# Patient Record
Sex: Male | Born: 1980
Health system: Southern US, Community
[De-identification: ages and names within clinical notes are randomized; demographics above are authoritative.]

## PROBLEM LIST (undated history)

## (undated) DIAGNOSIS — I10 Essential (primary) hypertension: Secondary | ICD-10-CM

## (undated) DIAGNOSIS — M25569 Pain in unspecified knee: Secondary | ICD-10-CM

## (undated) DIAGNOSIS — R51 Headache: Secondary | ICD-10-CM

## (undated) DIAGNOSIS — G8929 Other chronic pain: Secondary | ICD-10-CM

## (undated) HISTORY — PX: COLONOSCOPY: SHX174

## (undated) HISTORY — PX: APPENDECTOMY: SHX54

---

## 2001-05-23 ENCOUNTER — Emergency Department (HOSPITAL_COMMUNITY): Admission: EM | Admit: 2001-05-23 | Discharge: 2001-05-24 | Payer: Self-pay | Admitting: Emergency Medicine

## 2005-03-31 ENCOUNTER — Ambulatory Visit (HOSPITAL_COMMUNITY): Admission: RE | Admit: 2005-03-31 | Discharge: 2005-03-31 | Payer: Self-pay | Admitting: Pulmonary Disease

## 2005-04-01 ENCOUNTER — Ambulatory Visit (HOSPITAL_COMMUNITY): Admission: RE | Admit: 2005-04-01 | Discharge: 2005-04-01 | Payer: Self-pay | Admitting: Pulmonary Disease

## 2007-06-03 ENCOUNTER — Ambulatory Visit (HOSPITAL_COMMUNITY): Admission: RE | Admit: 2007-06-03 | Discharge: 2007-06-03 | Payer: Self-pay | Admitting: Pulmonary Disease

## 2007-09-01 ENCOUNTER — Inpatient Hospital Stay (HOSPITAL_COMMUNITY): Admission: AD | Admit: 2007-09-01 | Discharge: 2007-09-02 | Payer: Self-pay | Admitting: Psychiatry

## 2007-09-01 ENCOUNTER — Ambulatory Visit: Payer: Self-pay | Admitting: Psychiatry

## 2007-09-01 ENCOUNTER — Emergency Department (HOSPITAL_COMMUNITY): Admission: EM | Admit: 2007-09-01 | Discharge: 2007-09-01 | Payer: Self-pay | Admitting: Emergency Medicine

## 2010-02-01 ENCOUNTER — Other Ambulatory Visit (HOSPITAL_COMMUNITY): Payer: Self-pay | Admitting: Pulmonary Disease

## 2010-02-01 DIAGNOSIS — M542 Cervicalgia: Secondary | ICD-10-CM

## 2010-02-01 DIAGNOSIS — M549 Dorsalgia, unspecified: Secondary | ICD-10-CM

## 2010-02-04 ENCOUNTER — Encounter (HOSPITAL_COMMUNITY): Payer: Self-pay

## 2010-02-04 ENCOUNTER — Ambulatory Visit (HOSPITAL_COMMUNITY)
Admission: RE | Admit: 2010-02-04 | Discharge: 2010-02-04 | Disposition: A | Discharge status: Home / Self Care | Payer: Self-pay | Source: Ambulatory Visit | Attending: Pulmonary Disease | Admitting: Pulmonary Disease

## 2010-02-04 DIAGNOSIS — M5020 Other cervical disc displacement, unspecified cervical region: Secondary | ICD-10-CM | POA: Insufficient documentation

## 2010-02-04 DIAGNOSIS — M542 Cervicalgia: Secondary | ICD-10-CM | POA: Insufficient documentation

## 2010-02-05 ENCOUNTER — Ambulatory Visit (HOSPITAL_COMMUNITY)
Admission: RE | Admit: 2010-02-05 | Discharge: 2010-02-05 | Disposition: A | Discharge status: Home / Self Care | Payer: Self-pay | Source: Ambulatory Visit | Attending: Pulmonary Disease | Admitting: Pulmonary Disease

## 2010-02-05 ENCOUNTER — Encounter (HOSPITAL_COMMUNITY): Payer: Self-pay

## 2010-02-05 DIAGNOSIS — M549 Dorsalgia, unspecified: Secondary | ICD-10-CM

## 2010-02-05 DIAGNOSIS — M5126 Other intervertebral disc displacement, lumbar region: Secondary | ICD-10-CM | POA: Insufficient documentation

## 2010-02-05 DIAGNOSIS — M545 Low back pain, unspecified: Secondary | ICD-10-CM | POA: Insufficient documentation

## 2010-05-18 NOTE — H&P (Signed)
NAMEMarland Kitchen  NELSON, NOONE                 ACCOUNT NO.:  [id number]   MEDICAL RECORD NO.:  [id number]          PATIENT TYPE:  IPS   LOCATION:  0502                          FACILITY:  [facility]   PHYSICIAN:  Geoffery Lyons, M.D.      DATE OF BIRTH:  April 14, 1980   DATE OF ADMISSION:  09/01/2007  DATE OF DISCHARGE:                       PSYCHIATRIC ADMISSION ASSESSMENT   IDENTIFYING INFORMATION/JUSTIFICATION FOR ADMISSION AND CARE:  A 30-year-  old male that was voluntarily admitted on September 01, 2007.   HISTORY OF PRESENT ILLNESS:  The patient reports that he has been  depressed since his separation from his wife, he has been drinking  since.  He states on a slow day he drinks 5-6 beers, 12 ounces, and on  rough days will drink up to 18 at a time.  He has been drinking daily  for the past 4 months.  Wanted to get help, wanted to be sober for his  son.  He has had some recent conflict with his wife that he is separated  from, having some difficulty with her seeing other people, also  endorsing some financial issues although he is working full-time.  He  also reports ongoing back pain that he is being treated for by his  primary care Liandro Thelin, states his work adds to his back pain.   PAST PSYCHIATRIC HISTORY:  First admission to [facility].  No current outpatient mental health treatment.   SOCIAL HISTORY:  Patient is separated.  He has 62-1/2-year-old son.  Patient is able to see his son every Monday and Wednesday and every  other weekend.  The patient works at [employer], reporting  work is very busy at this time.  He does have a DUI 3 years ago.   FAMILY HISTORY:  Denies.   ALCOHOL/DRUG HISTORY:  Again, as above.  No history of seizures.   PRIMARY CARE Deshayla Empson:  Kari Baars, MD.   MEDICAL PROBLEMS:  Back pain.   MEDICATION:  Listed as Vicoprofen 7.5/200 one q.i.d. p.r.n.   DRUG ALLERGIES:  None.   PHYSICAL EXAM:  This is a well-nourished male who appears in no  acute  distress.  No tremors are noted.  Physical exam was reviewed in the  emergency department with no significant findings.  Temperature of 98.6,  55 heart rate, 18 respirations, blood pressure is 126/82.  He is 196  pounds.  He is 6 feet 3-1/4 inches tall.  Alcohol level in emergency  room was 143.  Urine drug screen is positive for opiates and positive  for benzodiazepines.  CBC is within normal limits.  His BMET is within  normal limits.  Liver enzymes are unavailable for review.   MENTAL STATUS EXAM:  This is a fully alert, cooperative male, casually  dressed, good eye contact.  Speech is clear, normal pace and tone.  Mood  is depressed, somewhat anxious.  Patient affect appears sad with some  depressed features.  Thought processes are coherent, no evidence of any  delusional statements.  Denies any suicidal thoughts.  Cognitive  function intact.  His memory is  good, judgment and insight is fair.   AXIS I:  1.  Depressive disorder, not otherwise specified.  2.  Alcohol  abuse, rule out dependence.  AXIS II:  Deferred.  AXIS III:  Back pain.  AXIS IV:  Problems with primary support group, which is separation from  wife, economic issues and medical problems.  AXIS V:  Current is 40.   PLAN:  To detox the patient on Librium protocol, work on relapse  intervention.  Lexapro was discussed with the patient.  Patient is  agreeable to resuming medication.  Patient may benefit from some  individual counseling.  Will also go ahead and order a liver function  and assess comorbidities.  The tentative length of stay is 2-3 days.      Landry Corporal, N.P.      Geoffery Lyons, M.D.  Electronically Signed    JO/MEDQ  D:  09/02/2007  T:  09/02/2007  Job:  [id number]

## 2010-05-21 NOTE — Discharge Summary (Signed)
NAMEMarland Hays  JODI, KAPPES                 ACCOUNT NO.:  [id number]   MEDICAL RECORD NO.:  [id number]          PATIENT TYPE:  IPS   LOCATION:  0502                          FACILITY:  [facility]   PHYSICIAN:  Geoffery Lyons, M.D.      DATE OF BIRTH:  1980-03-17   DATE OF ADMISSION:  09/01/2007  DATE OF DISCHARGE:  09/02/2007                               DISCHARGE SUMMARY   CHIEF COMPLAINT/HISTORY OF PRESENT ILLNESS:  This was the first  admission to [facility] for this 30 year old male,  voluntarily admitted.  Reported he has been depressed since his  separation from his wife.  He has been drinking since then.  A slow day  he drinks 5 to 6 beers, 12 ounces, and on a rough day he will drink up  to 18.  Has been drinking daily for the past 4 months.  Wanted to get  help.  Wanted to be sober for his son.  Has had some recent conflict  with his wife, who he is separated from, having some difficulty with her  seeing other people.  Also endorsing some financial issues although he  is working full time.  Also endorsed back pain that is being treated by  his primary care Loyal Holzheimer.   PAST PSYCHIATRIC HISTORY:  First time at [facility].  No current  outpatient treatment.   ALCOHOL AND DRUG HISTORY:  Increased use of alcohol.  No other  substances.   MEDICAL HISTORY:  Back pain.   MEDICATIONS:  Vicoprofen 7.5/200 one 4 times a day as needed.   PHYSICAL EXAMINATION:  Failed to show any acute findings.   LABORATORY WORKUP:  UDS positive for opiates and benzodiazepines.  Alcohol level 143.  BMET within normal limits.  CBC within normal  limits.   MENTAL STATUS EXAM:  Reveals a fully alert, cooperative male, casually  dressed.  Good eye contact.  Speech is clear, normal in pace, tone and  production.  Mood is anxious, depressed.  Affect depressed.  Thought  processes logical, coherent and relevant.  No evidence of delusions.  Denied any acute suicidal or homicidal ideations.  No  hallucinations.  Cognition well-preserved.   ADMISSION DIAGNOSES:  AXIS I:  Alcohol abuse, rule out dependence.  Depressive disorder not otherwise specified.  AXIS II:  No diagnosis.  AXIS III:  Back pain.  AXIS IV: Moderate.  AXIS V:  On admission 40, highest GAF in the last year 60.   COURSE IN THE HOSPITAL:  He was admitted.  He was detoxed with Librium.  As already stated, has been increasing his use of alcohol since  separation.  Endorsed increased depression.  Works with [employer]  and [employer], and this has taken a toll on his back.  There are  problems with money.  Conflict eventually became too much.  In 2003 he  split for 10-1/2 months.  He was given Lexapro.  Endorsed that he wanted  some help.  He initially was committed to stay in the unit.  As the day  progressed, he was wanting to be discharged.  He was denying active  suicidal or homicidal ideas.  The family was present, and they were  mostly putting pressure for him to be discharged home.  As he was in  full contact with reality, without any active suicidal or homicidal  ideations and was willing to pursue outpatient treatment, we went ahead  and discharged to outpatient followup.   DISCHARGE DIAGNOSES:  AXIS I:  Depressive disorder not otherwise  specified.  Alcohol abuse.  AXIS II:  No diagnosis.  AXIS III:  Back pain.  AXIS IV:  Moderate.  AXIS V:  On discharge 50.   Discharged on no medications.  He refused followup.      Geoffery Lyons, M.D.  Electronically Signed     IL/MEDQ  D:  09/14/2007  T:  09/15/2007  Job:  [id number]

## 2010-07-24 ENCOUNTER — Emergency Department (HOSPITAL_COMMUNITY): Payer: Self-pay

## 2010-07-24 ENCOUNTER — Emergency Department (HOSPITAL_COMMUNITY)
Admission: EM | Admit: 2010-07-24 | Discharge: 2010-07-25 | Disposition: A | Discharge status: Home / Self Care | Payer: Self-pay | Attending: Emergency Medicine | Admitting: Emergency Medicine

## 2010-07-24 DIAGNOSIS — G8929 Other chronic pain: Secondary | ICD-10-CM | POA: Insufficient documentation

## 2010-07-24 DIAGNOSIS — G4700 Insomnia, unspecified: Secondary | ICD-10-CM | POA: Insufficient documentation

## 2010-07-24 DIAGNOSIS — M549 Dorsalgia, unspecified: Secondary | ICD-10-CM | POA: Insufficient documentation

## 2010-07-24 DIAGNOSIS — F1920 Other psychoactive substance dependence, uncomplicated: Secondary | ICD-10-CM | POA: Insufficient documentation

## 2010-07-24 LAB — CBC
MCH: 30.6 pg (ref 26.0–34.0)
MCHC: 33.5 g/dL (ref 30.0–36.0)
MCV: 91.1 fL (ref 78.0–100.0)
RDW: 13 % (ref 11.5–15.5)
WBC: 19.6 10*3/uL — ABNORMAL HIGH (ref 4.0–10.5)

## 2010-07-24 LAB — DIFFERENTIAL
Basophils Absolute: 0.1 10*3/uL (ref 0.0–0.1)
Eosinophils Relative: 0 % (ref 0–5)
Lymphs Abs: 3.7 10*3/uL (ref 0.7–4.0)
Monocytes Absolute: 1.6 10*3/uL — ABNORMAL HIGH (ref 0.1–1.0)
Monocytes Relative: 8 % (ref 3–12)

## 2010-07-24 LAB — COMPREHENSIVE METABOLIC PANEL
ALT: 21 U/L (ref 0–53)
Albumin: 5 g/dL (ref 3.5–5.2)
CO2: 26 mEq/L (ref 19–32)
Chloride: 106 mEq/L (ref 96–112)
Creatinine, Ser: 0.88 mg/dL (ref 0.50–1.35)
GFR calc Af Amer: >60 mL/min (ref >60)
Potassium: 3.8 mEq/L (ref 3.5–5.1)
Sodium: 142 mEq/L (ref 135–145)
Total Bilirubin: 0.5 mg/dL (ref 0.3–1.2)

## 2010-07-24 LAB — RAPID URINE DRUG SCREEN, HOSP PERFORMED
Amphetamines: NOT DETECTED
Barbiturates: NOT DETECTED
Benzodiazepines: NOT DETECTED
Cocaine: NOT DETECTED
Opiates: NOT DETECTED
Tetrahydrocannabinol: NOT DETECTED

## 2010-07-24 LAB — URINALYSIS, ROUTINE W REFLEX MICROSCOPIC
Glucose, UA: NEGATIVE mg/dL
Leukocytes, UA: NEGATIVE
Nitrite: NEGATIVE
Urobilinogen, UA: 0.2 mg/dL (ref 0.0–1.0)

## 2010-07-28 ENCOUNTER — Emergency Department (HOSPITAL_COMMUNITY)
Admission: EM | Admit: 2010-07-28 | Discharge: 2010-07-28 | Discharge status: Against Medical Advice | Payer: Self-pay | Attending: *Deleted | Admitting: *Deleted

## 2010-07-28 ENCOUNTER — Encounter (HOSPITAL_COMMUNITY): Payer: Self-pay | Admitting: *Deleted

## 2010-07-28 DIAGNOSIS — X58XXXA Exposure to other specified factors, initial encounter: Secondary | ICD-10-CM | POA: Insufficient documentation

## 2010-07-28 DIAGNOSIS — Z5320 Procedure and treatment not carried out because of patient's decision for unspecified reasons: Secondary | ICD-10-CM | POA: Insufficient documentation

## 2010-07-28 DIAGNOSIS — T148XXA Other injury of unspecified body region, initial encounter: Secondary | ICD-10-CM | POA: Insufficient documentation

## 2010-07-28 NOTE — ED Notes (Signed)
Patient with tip of finger lac- right ring finger, accidentally cut on can lid

## 2010-07-28 NOTE — ED Notes (Signed)
Pt came to nurses station and stated he was leaving. Pt states he doesn't think his cut is that bad to have to wait all night for a doctor. Apologized to pt for wait time. Explained to pt that md would be in to see him as soon as possible. Encouraged pt to stay due to risks of infection in wound. Pt declined. Pt ambulated out of faciliyt with steady gait. Charge nurse aware.

## 2011-03-10 ENCOUNTER — Encounter (HOSPITAL_COMMUNITY): Payer: Self-pay | Admitting: *Deleted

## 2011-03-10 ENCOUNTER — Emergency Department (HOSPITAL_COMMUNITY): Payer: Self-pay

## 2011-03-10 ENCOUNTER — Emergency Department (HOSPITAL_COMMUNITY)
Admission: EM | Admit: 2011-03-10 | Discharge: 2011-03-10 | Disposition: A | Discharge status: Home Health | Payer: Self-pay | Attending: Emergency Medicine | Admitting: Emergency Medicine

## 2011-03-10 DIAGNOSIS — R1033 Periumbilical pain: Secondary | ICD-10-CM | POA: Insufficient documentation

## 2011-03-10 DIAGNOSIS — R110 Nausea: Secondary | ICD-10-CM | POA: Insufficient documentation

## 2011-03-10 LAB — DIFFERENTIAL
Basophils Relative: 0 % (ref 0–1)
Eosinophils Absolute: 0.1 10*3/uL (ref 0.0–0.7)
Eosinophils Relative: 1 % (ref 0–5)
Monocytes Absolute: 1 10*3/uL (ref 0.1–1.0)
Neutro Abs: 7.4 10*3/uL (ref 1.7–7.7)

## 2011-03-10 LAB — COMPREHENSIVE METABOLIC PANEL
ALT: 25 U/L (ref 0–53)
Albumin: 4 g/dL (ref 3.5–5.2)
Alkaline Phosphatase: 53 U/L (ref 39–117)
Calcium: 9.4 mg/dL (ref 8.4–10.5)
Chloride: 103 mEq/L (ref 96–112)

## 2011-03-10 LAB — CBC
Hemoglobin: 14.4 g/dL (ref 13.0–17.0)
MCHC: 34.5 g/dL (ref 30.0–36.0)
Platelets: 289 10*3/uL (ref 150–400)
RBC: 4.57 MIL/uL (ref 4.22–5.81)
WBC: 12.1 10*3/uL — ABNORMAL HIGH (ref 4.0–10.5)

## 2011-03-10 MED ORDER — CEPHALEXIN 500 MG PO CAPS: 500.0000 mg | ORAL_CAPSULE | Freq: Four times a day (QID) | ORAL | Status: AC

## 2011-03-10 MED ORDER — HYDROCODONE-ACETAMINOPHEN 5-325 MG PO TABS: 1.0000 | ORAL_TABLET | Freq: Four times a day (QID) | ORAL | Status: AC | PRN

## 2011-03-10 MED ORDER — IOHEXOL 300 MG/ML  SOLN
100.0000 mL | Freq: Once | INTRAMUSCULAR | Status: AC | PRN
  Administered 2011-03-10: 100 mL via INTRAVENOUS

## 2011-03-10 NOTE — ED Notes (Signed)
C/o stinging abd pain around umbilicus area, states belly button started bleeding, no blood noted in triage, denies diarrhea or vomiting

## 2011-03-10 NOTE — Discharge Instructions (Signed)
Follow up with dr. Leticia Penna next week.  Return if problems

## 2011-03-10 NOTE — ED Provider Notes (Signed)
History  This chart was scribed for Benny Lennert, MD by Bennett Scrape. This patient was seen in room APA14/APA14 and the patient's care was started at 6:28PM.  CSN: [id number]  Arrival date & time 03/10/11  1754   First MD Initiated Contact with Patient 03/10/11 1826      Chief Complaint  Patient presents with  . Abdominal Pain  . Nausea    Patient is a 31 y.o. male presenting with abdominal pain. The history is provided by the patient. No language interpreter was used.  Abdominal Pain The primary symptoms of the illness include abdominal pain. The primary symptoms of the illness do not include fever, fatigue, shortness of breath, nausea, vomiting, diarrhea or dysuria. The current episode started 1 to 2 hours ago. The onset of the illness was sudden. The problem has not changed since onset. The abdominal pain began 1 to 2 hours ago. The pain came on suddenly. The abdominal pain has been unchanged since its onset. The abdominal pain is located in the periumbilical region. The abdominal pain does not radiate. The abdominal pain is relieved by nothing.  The patient has not had a change in bowel habit. Symptoms associated with the illness do not include chills, diaphoresis, constipation, urgency, hematuria, frequency or back pain. Significant associated medical issues do not include inflammatory bowel disease, diabetes or diverticulitis.    Daniel Hays is a 31 y.o. male who presents to the Emergency Department complaining of 2.5 hours of sudden onset, non-changing, constant umbilical pain and bleeding. Pt reports that he was in the shower washing up with a rag when he noticed blood on the rag coming from the umbilical region. He states that the blood stopped after he was out of the shower and he has not experienced any bleeding in that area for over one hour since original onset. He denies any modifying factors. He has not taken any medications to improve the pain PTA. He denies emesis and  fever as associated symptoms. He denies any previous episodes of similar symptoms. He has no h/o chronic medical conditions. He is a current everyday smoker and occasional alcohol user.  History reviewed. No pertinent past medical history.  History reviewed. No pertinent past surgical history.  History reviewed. No pertinent family history.  History  Substance Use Topics  . Smoking status: Current Everyday Smoker -- 1.0 packs/day    Types: Cigarettes  . Smokeless tobacco: Not on file  . Alcohol Use: Yes     occ use      Review of Systems  Constitutional: Negative for fever, chills, diaphoresis and fatigue.  HENT: Negative for congestion, sinus pressure and ear discharge.   Eyes: Negative for discharge.  Respiratory: Negative for cough and shortness of breath.   Cardiovascular: Negative for chest pain.  Gastrointestinal: Positive for abdominal pain. Negative for nausea, vomiting, diarrhea and constipation.  Genitourinary: Negative for dysuria, urgency, frequency and hematuria.  Musculoskeletal: Negative for back pain.  Skin: Negative for rash.  Neurological: Negative for seizures and headaches.  Hematological: Negative.   Psychiatric/Behavioral: Negative for hallucinations.    Allergies  Review of patient's allergies indicates no known allergies.  Home Medications  No current outpatient prescriptions on file.  Triage Vitals: BP 132/77  Pulse 71  Temp(Src) 97.7 F (36.5 C) (Oral)  Resp 18  Ht 6' (1.829 m)  Wt 210 lb (95.255 kg)  BMI 28.48 kg/m2  SpO2 97%  Physical Exam  Nursing note and vitals reviewed. Constitutional: He is oriented  to person, place, and time. He appears well-developed and well-nourished.  HENT:  Head: Normocephalic and atraumatic.  Eyes: Conjunctivae and EOM are normal. No scleral icterus.  Neck: Neck supple. No thyromegaly present.  Cardiovascular: Normal rate and regular rhythm.  Exam reveals no gallop and no friction rub.   No murmur  heard. Pulmonary/Chest: Effort normal and breath sounds normal. No stridor. He has no wheezes. He has no rales. He exhibits no tenderness.  Abdominal: Soft. He exhibits no distension. There is tenderness (Moderately tender to umbilicus). There is no rebound.  Musculoskeletal: Normal range of motion. He exhibits no edema.  Lymphadenopathy:    He has no cervical adenopathy.  Neurological: He is alert and oriented to person, place, and time. Coordination normal.  Skin: Skin is warm and dry. No rash noted. No erythema.  Psychiatric: He has a normal mood and affect. His behavior is normal.    ED Course  Procedures (including critical care time)  DIAGNOSTIC STUDIES: Oxygen Saturation is 97% on room air, normal by my interpretation.    COORDINATION OF CARE: 6:28PM-Discussed treatment plan with pt and pt agreed to plan. 7:58PM-All labs and x-rays reviewed with patient and discharge plan discussed. Will prescribe pain and antibiotic medications and provide referral to surgeon for recheck in one week.    Labs Reviewed  CBC - Abnormal; Notable for the following:    WBC 12.1 (*)    All other components within normal limits  COMPREHENSIVE METABOLIC PANEL - Abnormal; Notable for the following:    Total Bilirubin 0.2 (*)    All other components within normal limits  DIFFERENTIAL    Ct Abdomen Pelvis W Contrast  03/10/2011  *RADIOLOGY REPORT*  Clinical Data: Periumbilical abdominal pain for 2 hours with bleeding from the umbilicus.  CT ABDOMEN AND PELVIS WITH CONTRAST  Technique:  Multidetector CT imaging of the abdomen and pelvis was performed following the standard protocol during bolus administration of intravenous contrast.  Contrast: OMNIPAQUE IOHEXOL 300 MG/ML IJ SOLN  Comparison: Lumbar MRI 02/05/2010.  Findings: The lung bases are clear.  There is no pleural effusion. Tiny low density lesion in the dome of the left thigh left hepatic lobe on image 6 is of doubtful significance.  The  liver otherwise appears normal.  The gallbladder, biliary system and pancreas appear normal. The spleen, adrenal glands and kidneys appear normal.  A moderate amount of stool is noted throughout the colon.  There are apparent surgical clips adjacent to the cecal tip, suggesting prior appendectomy.  The appendix is not visualized.  No pericecal inflammatory changes are seen.  The bowel gas pattern is nonobstructive.  The umbilicus appears normal.  No hernias are demonstrated.  There is no adenopathy.  The urinary bladder, prostate gland and seminal vesicles appear normal.  IMPRESSION: No acute abdominal pelvic findings identified.  Postsurgical changes suggesting prior appendectomy - correlate clinically.  Original Report Authenticated By: Gerrianne Scale, M.D.     No diagnosis found.    MDM  abd pain around umbilicus with bleeding.  Possible celulitus and hernia   The chart was scribed for me under my direct supervision.  I personally performed the history, physical, and medical decision making and all procedures in the evaluation of this patient.Benny Lennert, MD 03/10/11 2011

## 2011-03-10 NOTE — ED Notes (Signed)
Patient left before signing discharge.

## 2011-03-10 NOTE — ED Notes (Signed)
Report taken from T. Osborne, RN 

## 2011-10-24 ENCOUNTER — Emergency Department (HOSPITAL_COMMUNITY): Payer: BC Managed Care – PPO

## 2011-10-24 ENCOUNTER — Observation Stay (HOSPITAL_COMMUNITY)
Admission: EM | Admit: 2011-10-24 | Discharge: 2011-10-26 | DRG: 143 | Disposition: A | Discharge status: Home / Self Care | Payer: BC Managed Care – PPO | Attending: Pulmonary Disease | Admitting: Pulmonary Disease

## 2011-10-24 ENCOUNTER — Encounter (HOSPITAL_COMMUNITY): Payer: Self-pay | Admitting: Emergency Medicine

## 2011-10-24 ENCOUNTER — Inpatient Hospital Stay (HOSPITAL_COMMUNITY): Payer: BC Managed Care – PPO

## 2011-10-24 DIAGNOSIS — F17200 Nicotine dependence, unspecified, uncomplicated: Secondary | ICD-10-CM | POA: Diagnosis present

## 2011-10-24 DIAGNOSIS — R079 Chest pain, unspecified: Principal | ICD-10-CM | POA: Diagnosis present

## 2011-10-24 DIAGNOSIS — R7989 Other specified abnormal findings of blood chemistry: Secondary | ICD-10-CM

## 2011-10-24 DIAGNOSIS — M549 Dorsalgia, unspecified: Secondary | ICD-10-CM | POA: Diagnosis present

## 2011-10-24 DIAGNOSIS — G8929 Other chronic pain: Secondary | ICD-10-CM | POA: Diagnosis present

## 2011-10-24 DIAGNOSIS — F1210 Cannabis abuse, uncomplicated: Secondary | ICD-10-CM | POA: Diagnosis present

## 2011-10-24 DIAGNOSIS — F411 Generalized anxiety disorder: Secondary | ICD-10-CM | POA: Diagnosis present

## 2011-10-24 LAB — CBC WITH DIFFERENTIAL/PLATELET
Basophils Relative: 0 % (ref 0–1)
Eosinophils Absolute: 0 10*3/uL (ref 0.0–0.7)
Hemoglobin: 16.1 g/dL (ref 13.0–17.0)
MCH: 32.2 pg (ref 26.0–34.0)
MCHC: 34.9 g/dL (ref 30.0–36.0)
Monocytes Absolute: 1 10*3/uL (ref 0.1–1.0)
Monocytes Relative: 10 % (ref 3–12)
Neutrophils Relative %: 81 % — ABNORMAL HIGH (ref 43–77)

## 2011-10-24 LAB — HEPATIC FUNCTION PANEL
ALT: 29 U/L (ref 0–53)
AST: 19 U/L (ref 0–37)
Albumin: 4.6 g/dL (ref 3.5–5.2)
Alkaline Phosphatase: 67 U/L (ref 39–117)
Bilirubin, Direct: <0.1 mg/dL (ref 0.0–0.3)
Total Bilirubin: 0.2 mg/dL — ABNORMAL LOW (ref 0.3–1.2)
Total Protein: 8 g/dL (ref 6.0–8.3)

## 2011-10-24 LAB — RAPID URINE DRUG SCREEN, HOSP PERFORMED
Amphetamines: NOT DETECTED
Barbiturates: NOT DETECTED
Benzodiazepines: NOT DETECTED
Cocaine: NOT DETECTED
Opiates: NOT DETECTED
Tetrahydrocannabinol: POSITIVE — AB

## 2011-10-24 LAB — BASIC METABOLIC PANEL
BUN: 12 mg/dL (ref 6–23)
CO2: 27 mEq/L (ref 19–32)
Calcium: 10.2 mg/dL (ref 8.4–10.5)
Chloride: 102 mEq/L (ref 96–112)
Creatinine, Ser: 0.82 mg/dL (ref 0.50–1.35)
GFR calc Af Amer: >90 mL/min (ref >90)
GFR calc non Af Amer: >90 mL/min (ref >90)
Glucose, Bld: 115 mg/dL — ABNORMAL HIGH (ref 70–99)
Potassium: 3.6 mEq/L (ref 3.5–5.1)
Sodium: 140 mEq/L (ref 135–145)

## 2011-10-24 LAB — LIPASE, BLOOD: Lipase: 23 U/L (ref 11–59)

## 2011-10-24 LAB — TROPONIN I: Troponin I: 2.36 ng/mL (ref <0.30)

## 2011-10-24 LAB — POCT I-STAT TROPONIN I: Troponin i, poc: 0 ng/mL (ref 0.00–0.08)

## 2011-10-24 MED ORDER — IOHEXOL 350 MG/ML SOLN
100.0000 mL | Freq: Once | INTRAVENOUS | Status: AC | PRN
  Administered 2011-10-24: 100 mL via INTRAVENOUS

## 2011-10-24 MED ORDER — ENOXAPARIN SODIUM 40 MG/0.4ML ~~LOC~~ SOLN
40.0000 mg | SUBCUTANEOUS | Status: DC
  Administered 2011-10-24 – 2011-10-25 (×2): 40 mg via SUBCUTANEOUS
  Filled 2011-10-24 (×2): qty 0.4

## 2011-10-24 MED ORDER — SODIUM CHLORIDE 0.9 % IV SOLN: 250.0000 mL | INTRAVENOUS | Status: DC | PRN

## 2011-10-24 MED ORDER — ACETAMINOPHEN 325 MG PO TABS
650.0000 mg | ORAL_TABLET | Freq: Four times a day (QID) | ORAL | Status: DC | PRN
  Administered 2011-10-24: 650 mg via ORAL
  Filled 2011-10-24: qty 2

## 2011-10-24 MED ORDER — ALUM & MAG HYDROXIDE-SIMETH 200-200-20 MG/5ML PO SUSP: 30.0000 mL | Freq: Four times a day (QID) | ORAL | Status: DC | PRN

## 2011-10-24 MED ORDER — ASPIRIN 81 MG PO CHEW
CHEWABLE_TABLET | ORAL | Status: AC
  Filled 2011-10-24: qty 4

## 2011-10-24 MED ORDER — LORAZEPAM 1 MG PO TABS
1.0000 mg | ORAL_TABLET | Freq: Once | ORAL | Status: AC
  Administered 2011-10-24: 1 mg via ORAL
  Filled 2011-10-24: qty 1

## 2011-10-24 MED ORDER — BUPRENORPHINE HCL-NALOXONE HCL 8-2 MG SL FILM
1.0000 | ORAL_FILM | Freq: Three times a day (TID) | SUBLINGUAL | Status: DC
  Filled 2011-10-24 (×5): qty 1

## 2011-10-24 MED ORDER — SODIUM CHLORIDE 0.9 % IJ SOLN
3.0000 mL | Freq: Two times a day (BID) | INTRAMUSCULAR | Status: DC
  Administered 2011-10-24 – 2011-10-25 (×2): 3 mL via INTRAVENOUS
  Filled 2011-10-24: qty 3

## 2011-10-24 MED ORDER — TRAZODONE HCL 50 MG PO TABS: 25.0000 mg | ORAL_TABLET | Freq: Every evening | ORAL | Status: DC | PRN

## 2011-10-24 MED ORDER — ACETAMINOPHEN 500 MG PO TABS
1000.0000 mg | ORAL_TABLET | ORAL | Status: DC | PRN
  Administered 2011-10-24 – 2011-10-25 (×4): 1000 mg via ORAL
  Filled 2011-10-24 (×4): qty 2

## 2011-10-24 MED ORDER — ACETAMINOPHEN 650 MG RE SUPP: 650.0000 mg | Freq: Four times a day (QID) | RECTAL | Status: DC | PRN

## 2011-10-24 MED ORDER — ONDANSETRON HCL 4 MG PO TABS: 4.0000 mg | ORAL_TABLET | Freq: Four times a day (QID) | ORAL | Status: DC | PRN

## 2011-10-24 MED ORDER — ONDANSETRON HCL 4 MG/2ML IJ SOLN: 4.0000 mg | Freq: Four times a day (QID) | INTRAMUSCULAR | Status: DC | PRN

## 2011-10-24 MED ORDER — INDOMETHACIN 25 MG PO CAPS
25.0000 mg | ORAL_CAPSULE | Freq: Three times a day (TID) | ORAL | Status: DC
  Administered 2011-10-24 – 2011-10-25 (×3): 25 mg via ORAL
  Filled 2011-10-24 (×3): qty 1

## 2011-10-24 MED ORDER — ASPIRIN 81 MG PO CHEW
324.0000 mg | CHEWABLE_TABLET | Freq: Once | ORAL | Status: AC
  Administered 2011-10-24: 324 mg via ORAL

## 2011-10-24 MED ORDER — BUPRENORPHINE HCL-NALOXONE HCL 8-2 MG SL SUBL
1.0000 | SUBLINGUAL_TABLET | Freq: Three times a day (TID) | SUBLINGUAL | Status: DC
  Administered 2011-10-24 – 2011-10-26 (×4): 1 via SUBLINGUAL
  Filled 2011-10-24 (×5): qty 1

## 2011-10-24 MED ORDER — SODIUM CHLORIDE 0.9 % IJ SOLN: 3.0000 mL | INTRAMUSCULAR | Status: DC | PRN

## 2011-10-24 NOTE — Consult Note (Signed)
Reason for Consult:chest pain with elevated troponin  Referring Physician: ER MD   Daniel Hays is an 31 y.o. male.   HPI: The patientis a pleasant 31 yo man with a h/o tobacco abuse who was in his usual state of health until yesterday when he began to experience SSCP, with some radiation to the back. No nause, vomiting or diaphoresis. Movement or change in the position does not improve his symptoms. He decided to present to the ER today for additional evaluation. Initial troponin elevated at 1. He has a negative D-dimer and CT scan of the chest does not reveal a PE or dissection. ECG is unremarkable with no STT changes. He currently has minimal pain.   ZOX:WRUEAVW reviewed. No pertinent past medical history. except for a h/o appendicitis  PSHX: Past Surgical History  Procedure Date  . Appendectomy     FAMHX:History reviewed. No pertinent family history.  Social History:  reports that he has been smoking Cigarettes.  He has been smoking about 1 pack per day. He does not have any smokeless tobacco history on file. He reports that he drinks alcohol. He reports that he does not use illicit drugs. except he admits to recent [facility] use. Denies cocaine.  Allergies: No Known Allergies  Medications: none on admit  Ct Angio Chest W/cm &/or Wo Cm  10/24/2011  *RADIOLOGY REPORT*  Clinical Data: Chest pain and pressure radiating to back, started yesterday, shortness of breath, question pulmonary embolism  CT ANGIOGRAPHY CHEST  Technique:  Multidetector CT imaging of the chest using the standard protocol during bolus administration of intravenous contrast. Multiplanar reconstructed images including MIPs were obtained and reviewed to evaluate the vascular anatomy.  Contrast: OMNIPAQUE IOHEXOL 350 MG/ML SOLN  Comparison: None.  Findings: Aorta normal caliber without aneurysm or dissection. Mild residual thymic tissue anterior mediastinum. No thoracic adenopathy. Visualized portion of upper abdomen  unremarkable.  Pulmonary arteries patent. No evidence of pulmonary embolism. Dependent atelectasis in posterior lower lobes bilaterally. No pulmonary infiltrate, pleural effusion or pneumothorax. Few tiny 1-2 mm diameter nonspecific questionable nodular foci in the upper lobes bilaterally. No acute osseous findings.  IMPRESSION: Mild dependent atelectasis in the posterior lower lobes bilaterally. No evidence of pulmonary embolism. Few tiny nonspecific 1-2 mm diameter nodular foci in bilateral upper lobes, recommendation below. If the patient is at high risk for bronchogenic carcinoma, follow- up chest CT at 1 year is recommended.  If the patient is at low risk, no follow-up is needed.  This recommendation follows the consensus statement: Guidelines for Management of Small Pulmonary Nodules Detected on CT Scans:  A Statement from the Fleischner Society as published in Radiology 2005; 237:395-400.   Original Report Authenticated By: Lollie Marrow, M.D.    Dg Chest Portable 1 View  10/24/2011  *RADIOLOGY REPORT*  Clinical Data: Chest and right arm pain, smoker  PORTABLE CHEST - 1 VIEW  Comparison: Portable exam 1019 hours compared to 07/24/2010  Findings: Normal heart size, mediastinal contours, and pulmonary vascularity. Lungs clear. No pleural effusion or pneumothorax. Bones unremarkable.  IMPRESSION: No acute abnormalities.   Original Report Authenticated By: Lollie Marrow, M.D.     ROS  As stated in the HPI and negative for all other systems.  Physical Exam  Vitals:Blood pressure 128/76, pulse 60, temperature 97.7 F (36.5 C), temperature source Oral, resp. rate 16, height 6\' 1"  (1.854 m), weight 200 lb (90.719 kg), SpO2 100.00%.  Well appearing young man, NAD HEENT: Unremarkable Neck:  No JVD,  no thyromegally Lungs:  Clear with no wheezes, rales, or rhonchi HEART:  Regular rate rhythm, no murmurs, no rubs, no clicks Abd:  Flat, positive bowel sounds, no organomegally, no rebound, no  guarding Ext:  2 plus pulses, no edema, no cyanosis, no clubbing Skin:  No rashes no nodules Neuro:  CN II through XII intact, motor grossly intact  ECG - NSR with no STT changes.  Assessment/Plan: 1.  Chest pain - his history sounds more pleuritic than anginal. I wonder about pericarditis. He had a viral syndrome about one month ago. I have recommended that his troponin be repeated. If up, would transfer to [facility] hospital for diagnostic catheterization. If trending down, would treat with NSAID's and a stress test tomorrow. Hold off on heparin for now. If he has pericarditis would not want systemic anti-coagulation. Ok to treat pain with MSO4.  Would check a 2D echo and ESR.   Sharlot Gowda TaylorMD 10/24/2011, 1:11 PM

## 2011-10-24 NOTE — ED Provider Notes (Signed)
This chart was scribed for Ward Givens, MD, MD by Smitty Pluck. The patient was seen in room APA12 and the patient's care was started at 11:36AM.  Pt reports moderate central chest pain radiating to back has been constant for past 2 days. Pt reports SOB and chest pain is aggravated by deep breathing. Denies pain in his legs but states he thinks he is getting plantar fascitis again b/o pain in his heels.  Denies swelling in legs, cough and family hx of CAD. Pt has soft calves without pain to palpation.    Will get CT angio chest with above history, PA Beverely Pace has talked to Dr Juanetta Gosling about admission.   EKG's reviewed by me and are normal.   Medical screening examination/treatment/procedure(s) were conducted as a shared visit with non-physician practitioner(s) and myself.  I personally evaluated the patient during the encounter  Devoria Albe, MD, FACEP  I personally performed the services described in this documentation, which was scribed in my presence. The recorded information has been reviewed and considered.    Ward Givens, MD 10/24/11 1159

## 2011-10-24 NOTE — ED Notes (Signed)
CRITICAL VALUE ALERT  Critical value received:  Trop 1.93  Date of notification:  10/24/11  Time of notification:  10:42  Critical value read back:yes  Nurse who received alert:  Lilia Pro RN  MD notified (1st page):  Ivery Quale PA  Time of first page:  10:43  MD notified (2nd page):  Time of second page:  Responding MD:  Loney Laurence PA  Time MD responded:  10:43

## 2011-10-24 NOTE — Progress Notes (Signed)
CRITICAL VALUE ALERT  Critical value received:  Troponin 2.36  Date of notification:  10/24/11  Time of notification: 2345  Critical value read back:no  Nurse who received alert: Rockwell Germany RN found critical herself  MD notified (1st page):  Dr. Janna Arch  Time of first page:  2345  MD notified (2nd page):  Time of second page:  Responding MD:  Dr. Janna Arch  Time MD responded: 2355

## 2011-10-24 NOTE — ED Notes (Signed)
Pt c/o chest pain/pressure that radiates to back that started yesterday at 1500. Pt denies having this type of pressure before.

## 2011-10-24 NOTE — ED Provider Notes (Signed)
History     CSN: [id number]  Arrival date & time 10/24/11  9629   First MD Initiated Contact with Patient 10/24/11 [phone]      Chief Complaint  Patient presents with  . Chest Pain    (Consider location/radiation/quality/duration/timing/severity/associated sxs/prior treatment) HPI Comments: Patient states he started having chest pain and pressure radiating to the back on October 20 at 3 PM. The patient denies any injury or trauma. He states that he had some problem with cough approximately a week ago but no excessive cough recently. He has been having some vomiting recently but no other chills fever diarrhea or other problems.  It is also of note that the patient has been on Suboxone to assist him with opiate dependence. The patient states that he used the 40 mg of oxycodone on October 19. He denies using any on October 20 or 21.there's been no loss of consciousness. There's been no radiation of the pain to the neck or jaw or arm.  Patient is a 31 y.o. male presenting with chest pain. The history is provided by the patient.  Chest Pain The chest pain began yesterday. Chest pain occurs constantly. The chest pain is unchanged. The severity of the pain is moderate. The pain radiates to the upper back. Exacerbated by: cough. Primary symptoms include shortness of breath and cough. Pertinent negatives for primary symptoms include no fever, no syncope, no wheezing, no palpitations, no abdominal pain, no vomiting and no dizziness.  Associated symptoms include orthopnea and paroxysmal nocturnal dyspnea.  Pertinent negatives for associated symptoms include no claudication, no near-syncope, no numbness and no weakness. He tried nothing for the symptoms. Risk factors include smoking/tobacco exposure, alcohol intake and substance abuse.  Pertinent negatives for past medical history include no aneurysm, no arrhythmia, no hypertension, no MI, no mitral valve prolapse, no PE, no seizures and no sleep apnea.    Pertinent negatives for family medical history include: no sudden death in family.    Chest Pain  The current episode started yesterday. The problem occurs constantly. The problem has been unchanged. The pain is moderate. The pain radiates to the upper back. Associated symptoms include a cough, orthopnea and shortness of breath. Pertinent negatives include no abdominal pain, back pain, claudication, dizziness, fever, near-syncope, numbness, palpitations, syncope, vomiting or weakness. He has tried nothing for the symptoms. Risk factors include smoking/tobacco exposure, alcohol intake and substance abuse.  Pertinent negatives for past medical history include no aneurysm, no arrhythmia, no hypertension, no MI, no mitral valve prolapse, no PE, no seizures and no sleep apnea.  Pertinent negatives for family medical history include: no sudden death in family.    History reviewed. No pertinent past medical history.  Past Surgical History  Procedure Date  . Appendectomy     History reviewed. No pertinent family history.  History  Substance Use Topics  . Smoking status: Current Every Day Smoker -- 1.0 packs/day    Types: Cigarettes  . Smokeless tobacco: Not on file  . Alcohol Use: Yes     occ use      Review of Systems  Constitutional: Negative for fever and activity change.       All ROS Neg except as noted in HPI  HENT: Negative for nosebleeds and neck pain.   Eyes: Negative for photophobia and discharge.  Respiratory: Positive for cough and shortness of breath. Negative for wheezing.   Cardiovascular: Positive for chest pain and orthopnea. Negative for palpitations, claudication, syncope and near-syncope.  Gastrointestinal: Negative  for vomiting, abdominal pain and blood in stool.  Genitourinary: Negative for dysuria, frequency and hematuria.  Musculoskeletal: Negative for back pain and arthralgias.  Skin: Negative.   Neurological: Negative for dizziness, seizures, speech  difficulty, weakness and numbness.  Psychiatric/Behavioral: Negative for hallucinations and confusion.    Allergies  Review of patient's allergies indicates no known allergies.  Home Medications   Current Outpatient Rx  Name Route Sig Dispense Refill  . BUPRENORPHINE HCL-NALOXONE HCL 8-2 MG SL FILM Sublingual Place 1 each under the tongue 3 (three) times daily.      BP 133/97  Pulse 62  Temp 97.7 F (36.5 C) (Oral)  Resp 16  Ht 6\' 1"  (1.854 m)  Wt 200 lb (90.719 kg)  BMI 26.39 kg/m2  SpO2 100%  Physical Exam  Nursing note and vitals reviewed. Constitutional: He is oriented to person, place, and time. He appears well-developed and well-nourished.  Non-toxic appearance.  HENT:  Head: Normocephalic.  Right Ear: Tympanic membrane and external ear normal.  Left Ear: Tympanic membrane and external ear normal.  Eyes: EOM and lids are normal. Pupils are equal, round, and reactive to light.       There is a small some conjunctival hemorrhage in the lower portion of the left eye. There is a healing abrasion of the bridge of the nose. There is no oral tenderness or step off.  Funduscopic examination reveals no hemorrhage or exudate appreciated. The disc margins are sharp and flat.  Neck: Normal range of motion. Neck supple. Carotid bruit is not present.  Cardiovascular: Regular rhythm, normal heart sounds, intact distal pulses and normal pulses.  Bradycardia present.   Pulmonary/Chest: Breath sounds normal. No respiratory distress.  Abdominal: Soft. Bowel sounds are normal. There is no tenderness. There is no guarding.  Musculoskeletal: Normal range of motion.  Lymphadenopathy:       Head (right side): No submandibular adenopathy present.       Head (left side): No submandibular adenopathy present.    He has no cervical adenopathy.  Neurological: He is alert and oriented to person, place, and time. He has normal strength. No cranial nerve deficit or sensory deficit.  Skin: Skin is  warm and dry.  Psychiatric: He has a normal mood and affect. His speech is normal.    ED Course  Procedures (including critical care time)  Labs Reviewed  CBC WITH DIFFERENTIAL - Abnormal; Notable for the following:    Neutrophils Relative 81 (*)     Neutro Abs 8.3 (*)     Lymphocytes Relative 9 (*)     All other components within normal limits  URINE RAPID DRUG SCREEN (HOSP PERFORMED) - Abnormal; Notable for the following:    Tetrahydrocannabinol POSITIVE (*)     All other components within normal limits  BASIC METABOLIC PANEL  TROPONIN I  HEPATIC FUNCTION PANEL  LIPASE, BLOOD   No results found. EKG 9:45 rate 69. Rhythm- normal sinus with sinus arrhythmia. PR-within normal limits. Axis-normal. QRS-normal. ST-T-normal. No life-threatening arrhythmia. No STEMI.  EKG 10:51  rate-59. Rhythm sinus bradycardia. PR-within normal limits. Axis-normal. QRS-normal. ST-T-normal. No life-threatening arrhythmia. With the exception of change in rate, unchanged from previous EKG at 945 this morning.No STEMI.  No diagnosis found.    MDM  I have reviewed nursing notes, vital signs, and all appropriate lab and imaging results for this patient. Patient continues to have complaint of chest pain in the emergency department. Aspirin none given. Repeat electrocardiogram obtained and is unchanged from the  initial exam with a bradycardia of 59 otherwise nonacute.  The basic metabolic panel within normal limits. The troponin is elevated at 1.93. The hepatic function studies show the total bilirubin to be low at 0.2. The lipase is normal at 23. The urine drug screen is positive for THC otherwise negative. The complete blood count is within normal limits. The chest x-ray shows no acute abnormality. Patient seen with me by Dr. Lynelle Doctor. Case to be discussed with Dr. Juanetta Gosling, the patient's primary care physician. Case discussed with Dr. Juanetta Gosling, he will have the patient admitted.      Kathie Dike,  PA 10/24/11 [address] [place], [place] 10/24/11 1225

## 2011-10-24 NOTE — Progress Notes (Signed)
Patient ID: Daniel Hays, male   DOB: May 03, 1980, 31 y.o.   MRN: [id number]  I have reviewed repeat troponin which is unchanged from initial value. At this point it would be okay to admit to telemetry, treat with ibuprofen or Indocin and narcotics. Check 2-D echo. If left ventricular function is normal, would proceed with stress testing.  Sharrell Ku, M.D.

## 2011-10-24 NOTE — ED Notes (Signed)
Repeat ECG completed on pt. Report was given to Dr.Iva Lynelle Doctor

## 2011-10-24 NOTE — Plan of Care (Signed)
Problem: Phase I Progression Outcomes Goal: Aspirin unless contraindicated Outcome: Progressing Received 324mg  po ASA in ED before arrival to floor  Problem: Phase II Progression Outcomes Goal: Hemodynamically stable Outcome: Progressing Bradycardic HR dropped to 52 at times Goal: CV Risk Factors identified Outcome: Progressing Smoker

## 2011-10-25 ENCOUNTER — Observation Stay (HOSPITAL_COMMUNITY): Payer: BC Managed Care – PPO

## 2011-10-25 DIAGNOSIS — R072 Precordial pain: Secondary | ICD-10-CM

## 2011-10-25 LAB — TROPONIN I
Troponin I: 2.41 ng/mL (ref <0.30)
Troponin I: 2.5 ng/mL (ref <0.30)

## 2011-10-25 MED ORDER — LORAZEPAM 0.5 MG PO TABS
0.5000 mg | ORAL_TABLET | ORAL | Status: DC | PRN
  Administered 2011-10-25 – 2011-10-26 (×5): 0.5 mg via ORAL
  Filled 2011-10-25 (×5): qty 1

## 2011-10-25 MED ORDER — SERTRALINE HCL 50 MG PO TABS
50.0000 mg | ORAL_TABLET | Freq: Every day | ORAL | Status: DC
  Administered 2011-10-25 – 2011-10-26 (×2): 50 mg via ORAL
  Filled 2011-10-25 (×2): qty 1

## 2011-10-25 MED ORDER — SODIUM CHLORIDE 0.9 % IJ SOLN
INTRAMUSCULAR | Status: AC
  Filled 2011-10-25: qty 3

## 2011-10-25 NOTE — H&P (Signed)
Daniel Hays, STRACENER                 ACCOUNT NO.:  [id number]  MEDICAL RECORD NO.:  [id number]  LOCATION:  [facility]                         FACILITY:  [facility]  PHYSICIAN:  Daniel Hays, Daniel HaysDATE OF BIRTH:  1980-08-18  DATE OF ADMISSION:  10/24/2011 DATE OF DISCHARGE:  LH                             HISTORY & PHYSICAL   REASON FOR ADMISSION:  Chest pain.  HISTORY:  This is a 31 year old, who had been in his usual state of fair health, and on the morning of admission developed chest pain.  This was mostly pleuritic.  He has not had any cough or congestion.  He has no other associated symptoms.  He says it was hard to take a deep breath. This radiated to some extent to his back.  When he came to the emergency room, he had a negative D-dimer, had chest CT that showed no pulmonary embolus.  No aortic dissection.  EKG was normal, but his troponin is elevated.  PAST MEDICAL HISTORY:  Positive for chronic back pain and he has been at a Pain Clinic.  He has had an appendectomy.  FAMILY HISTORY:  Negative for any sort of cardiac disease.  SOCIAL HISTORY:  He smokes about a packet of cigarettes daily.  He does drink alcohol.  He has been using some cannabis and he has used some 3 narcotics recently.  ALLERGIES:  He has no known drug allergies.  He is on Suboxone.  REVIEW OF SYSTEMS:  Except as mentioned is negative.  PHYSICAL EXAMINATION:  GENERAL:  Well-developed, well-nourished male, who is in no acute distress. HEENT:  His pupils are reactive.  His nose and throat are clear. NECK:  Supple without masses. CHEST:  Clear. HEART:  Regular. ABDOMEN:  Soft. EXTREMITIES:  No edema. CENTRAL NERVOUS SYSTEM:  Grossly intact.  LABORATORY DATA:  As mentioned, initial troponin was about 1.6 __________ 1.91.  His CT does not show anything specific.  EKG did not show anything specific.  ASSESSMENT:  He has chest pain which seems to be mostly pleuritic.  He is going to have serial cardiac  enzymes, have an echocardiogram to rule out pericarditis.  I will have another chest x-ray in the morning, and he will probably have a stress test tomorrow.     Daniel Hays, M.D.     ELH/MEDQ  D:  10/24/2011  T:  10/24/2011  Job:  [id number]

## 2011-10-25 NOTE — Progress Notes (Signed)
Subjective: He continues to have chest pain but it is less. His EKG this morning is essentially normal but he continues to have increased troponin.  Objective: Vital signs in last 24 hours: Temp:  [98 F (36.7 C)-98.8 F (37.1 C)] 98 F (36.7 C) (10/22 0837) Pulse Rate:  [47-79] 51  (10/22 0800) Resp:  [11-19] 11  (10/22 0700) BP: (104-138)/(54-98) 124/74 mmHg (10/22 0800) SpO2:  [96 %-100 %] 98 % (10/22 0800) Weight:  [88.8 kg (195 lb 12.3 oz)-91.1 kg (200 lb 13.4 oz)] 91.1 kg (200 lb 13.4 oz) (10/22 0439) Weight change:  Last BM Date: 10/24/11  Intake/Output from previous day: 10/21 0701 - 10/22 0700 In: 240 [P.O.:240] Out: 625 [Urine:625]  PHYSICAL EXAM General appearance: alert, cooperative and mild distress Resp: clear to auscultation bilaterally Cardio: regular rate and rhythm, S1, S2 normal, no murmur, click, rub or gallop GI: soft, non-tender; bowel sounds normal; no masses,  no organomegaly Extremities: extremities normal, atraumatic, no cyanosis or edema  Lab Results:    Basic Metabolic Panel:  Basename 10/24/11 1007  NA 140  K 3.6  CL 102  CO2 27  GLUCOSE 115*  BUN 12  CREATININE 0.82  CALCIUM 10.2  MG --  PHOS --   Liver Function Tests:  Basename 10/24/11 1007  AST 19  ALT 29  ALKPHOS 67  BILITOT 0.2*  PROT 8.0  ALBUMIN 4.6    Basename 10/24/11 1007  LIPASE 23  AMYLASE --   No results found for this basename: AMMONIA:2 in the last 72 hours CBC:  Basename 10/24/11 1007  WBC 10.3  NEUTROABS 8.3*  HGB 16.1  HCT 46.1  MCV 92.2  PLT 290   Cardiac Enzymes:  Basename 10/25/11 0410 10/24/11 2203 10/24/11 1413  CKTOTAL -- -- --  CKMB -- -- --  CKMBINDEX -- -- --  TROPONINI 2.41* 2.36* 1.91*   BNP: No results found for this basename: PROBNP:3 in the last 72 hours D-Dimer:  [facility] 10/24/11 1007  DDIMER <0.27   CBG: No results found for this basename: GLUCAP:6 in the last 72 hours Hemoglobin A1C: No results found for this  basename: HGBA1C in the last 72 hours Fasting Lipid Panel: No results found for this basename: CHOL,HDL,LDLCALC,TRIG,CHOLHDL,LDLDIRECT in the last 72 hours Thyroid Function Tests: No results found for this basename: TSH,T4TOTAL,FREET4,T3FREE,THYROIDAB in the last 72 hours Anemia Panel: No results found for this basename: VITAMINB12,FOLATE,FERRITIN,TIBC,IRON,RETICCTPCT in the last 72 hours Coagulation: No results found for this basename: LABPROT:2,INR:2 in the last 72 hours Urine Drug Screen: Drugs of Abuse     Component Value Date/Time   LABOPIA NONE DETECTED 10/24/2011 1007   COCAINSCRNUR NONE DETECTED 10/24/2011 1007   LABBENZ NONE DETECTED 10/24/2011 1007   AMPHETMU NONE DETECTED 10/24/2011 1007   THCU POSITIVE* 10/24/2011 1007   LABBARB NONE DETECTED 10/24/2011 1007    Alcohol Level: No results found for this basename: ETH:2 in the last 72 hours Urinalysis: No results found for this basename: COLORURINE:2,APPERANCEUR:2,LABSPEC:2,PHURINE:2,GLUCOSEU:2,HGBUR:2,BILIRUBINUR:2,KETONESUR:2,PROTEINUR:2,UROBILINOGEN:2,NITRITE:2,LEUKOCYTESUR:2 in the last 72 hours Misc. Labs:  ABGS No results found for this basename: PHART,PCO2,PO2ART,TCO2,HCO3 in the last 72 hours CULTURES Recent Results (from the past 240 hour(s))  MRSA PCR SCREENING     Status: Normal   Collection Time   10/24/11  2:30 PM      Component Value Range Status Comment   MRSA by PCR NEGATIVE  NEGATIVE Final    Studies/Results: X-ray Chest Pa And Lateral   10/25/2011  *RADIOLOGY REPORT*  Clinical Data: History of pleuritic pain.  CHEST - 2 VIEW  Comparison: 07/24/2010.  10/24/2011.  CT 10/24/2011.  Findings: There is stable normal cardiac size and shape. Mediastinal and hilar contours appear normal and stable.  There is slight flattening of the diaphragm on lateral image which may reflect minimal hyperinflation.  No pulmonary infiltrates or masses were evident. No pleural abnormality is evident.  No skeletal lesions are  seen.  IMPRESSION: Slight flattening of diaphragm on lateral image may reflect minimal hyperinflation.  No pulmonary infiltrates are seen.  No pleural disease is evident.  No evidence of pneumothorax.  No rib lesion is evident.   Original Report Authenticated By: Crawford Givens, M.D.    Ct Angio Chest W/cm &/or Wo Cm  10/24/2011  *RADIOLOGY REPORT*  Clinical Data: Chest pain and pressure radiating to back, started yesterday, shortness of breath, question pulmonary embolism  CT ANGIOGRAPHY CHEST  Technique:  Multidetector CT imaging of the chest using the standard protocol during bolus administration of intravenous contrast. Multiplanar reconstructed images including MIPs were obtained and reviewed to evaluate the vascular anatomy.  Contrast: OMNIPAQUE IOHEXOL 350 MG/ML SOLN  Comparison: None.  Findings: Aorta normal caliber without aneurysm or dissection. Mild residual thymic tissue anterior mediastinum. No thoracic adenopathy. Visualized portion of upper abdomen unremarkable.  Pulmonary arteries patent. No evidence of pulmonary embolism. Dependent atelectasis in posterior lower lobes bilaterally. No pulmonary infiltrate, pleural effusion or pneumothorax. Few tiny 1-2 mm diameter nonspecific questionable nodular foci in the upper lobes bilaterally. No acute osseous findings.  IMPRESSION: Mild dependent atelectasis in the posterior lower lobes bilaterally. No evidence of pulmonary embolism. Few tiny nonspecific 1-2 mm diameter nodular foci in bilateral upper lobes, recommendation below. If the patient is at high risk for bronchogenic carcinoma, follow- up chest CT at 1 year is recommended.  If the patient is at low risk, no follow-up is needed.  This recommendation follows the consensus statement: Guidelines for Management of Small Pulmonary Nodules Detected on CT Scans:  A Statement from the Fleischner Society as published in Radiology 2005; 237:395-400.   Original Report Authenticated By: Lollie Marrow, M.D.     Dg Chest Portable 1 View  10/24/2011  *RADIOLOGY REPORT*  Clinical Data: Chest and right arm pain, smoker  PORTABLE CHEST - 1 VIEW  Comparison: Portable exam 1019 hours compared to 07/24/2010  Findings: Normal heart size, mediastinal contours, and pulmonary vascularity. Lungs clear. No pleural effusion or pneumothorax. Bones unremarkable.  IMPRESSION: No acute abnormalities.   Original Report Authenticated By: Lollie Marrow, M.D.     Medications:  Prior to Admission:  Prescriptions prior to admission  Medication Sig Dispense Refill  . Buprenorphine HCl-Naloxone HCl (SUBOXONE) 8-2 MG FILM Place 1 each under the tongue 3 (three) times daily.       Scheduled:   . aspirin      . buprenorphine-naloxone  1 tablet Sublingual TID  . enoxaparin (LOVENOX) injection  40 mg Subcutaneous Q24H  . indomethacin  25 mg Oral TID WC  . sodium chloride  3 mL Intravenous Q12H  . DISCONTD: Buprenorphine HCl-Naloxone HCl  1 each Sublingual TID   Continuous:  JYN:WGNFAO chloride, acetaminophen, alum & mag hydroxide-simeth, iohexol, LORazepam, ondansetron (ZOFRAN) IV, ondansetron, sodium chloride, traZODone, DISCONTD: acetaminophen, DISCONTD: acetaminophen  Assesment: He has chest pain which is better. He has elevated troponin level which is still going up. He has some anxiety. He had apparently taken some narcotic pain medication while he was taking his Suboxone and I don't know if that can produce  chest pain and/or elevated troponin or not Active Problems:  * No active hospital problems. *     Plan: He is to have a stress test today. He had echocardiogram and the results of that are pending.    LOS: 1 day   Chantry Headen L 10/25/2011, 11:47 AM

## 2011-10-25 NOTE — Progress Notes (Signed)
*  PRELIMINARY RESULTS* Echocardiogram 2D Echocardiogram has been performed.  Conrad [place] 10/25/2011, 9:22 AM

## 2011-10-25 NOTE — Progress Notes (Signed)
Notified Dr. Janna Arch of elevated troponing 2.36 at 2200.  Orders written.

## 2011-10-25 NOTE — Progress Notes (Signed)
CRITICAL VALUE ALERT  Critical value received:  Troponin 2.41  Date of notification:  10/25/11  Time of notification: 0630  Critical value read back:yes  Nurse who received alert:  Rockwell Germany RN  MD notified (1st page):  Dr. Juanetta Gosling  Time of first page:  0700  MD notified (2nd page):  Time of second page:  Responding MD: Dr. Juanetta Gosling  Time MD responded:

## 2011-10-25 NOTE — Progress Notes (Signed)
Report given to dept 300 rn. Transferred to rm 303 via wheelchair. No distress, pain noted

## 2011-10-25 NOTE — Progress Notes (Signed)
UR chart review completed.  

## 2011-10-26 ENCOUNTER — Inpatient Hospital Stay (HOSPITAL_COMMUNITY): Payer: BC Managed Care – PPO

## 2011-10-26 ENCOUNTER — Encounter (HOSPITAL_COMMUNITY): Payer: Self-pay | Admitting: Cardiology

## 2011-10-26 ENCOUNTER — Encounter (HOSPITAL_COMMUNITY): Payer: Self-pay

## 2011-10-26 DIAGNOSIS — R079 Chest pain, unspecified: Secondary | ICD-10-CM

## 2011-10-26 LAB — TROPONIN I: Troponin I: 2.37 ng/mL (ref <0.30)

## 2011-10-26 MED ORDER — TECHNETIUM TC 99M SESTAMIBI - CARDIOLITE
10.0000 | Freq: Once | INTRAVENOUS | Status: AC | PRN
  Administered 2011-10-26: 10 via INTRAVENOUS

## 2011-10-26 MED ORDER — NICOTINE 21 MG/24HR TD PT24: 21.0000 mg | MEDICATED_PATCH | Freq: Every day | TRANSDERMAL | Status: DC

## 2011-10-26 MED ORDER — TECHNETIUM TC 99M SESTAMIBI - CARDIOLITE
30.0000 | Freq: Once | INTRAVENOUS | Status: AC | PRN
  Administered 2011-10-26: 12:00:00 31 via INTRAVENOUS

## 2011-10-26 MED ORDER — SERTRALINE HCL 50 MG PO TABS: 50.0000 mg | ORAL_TABLET | Freq: Every day | ORAL | Status: DC

## 2011-10-26 MED ORDER — SODIUM CHLORIDE 0.9 % IJ SOLN
INTRAMUSCULAR | Status: AC
  Administered 2011-10-26: 10 mL via INTRAVENOUS
  Filled 2011-10-26: qty 10

## 2011-10-26 NOTE — Progress Notes (Signed)
Subjective: He is complaining of a headache but otherwise doing okay. His chest pain is better  Objective: Vital signs in last 24 hours: Temp:  [97.9 F (36.6 C)-98.5 F (36.9 C)] 97.9 F (36.6 C) (10/23 0543) Pulse Rate:  [48-82] 58  (10/23 0543) Resp:  [12-27] 18  (10/23 0543) BP: (92-129)/(71-84) 110/73 mmHg (10/23 0543) SpO2:  [96 %-99 %] 97 % (10/23 0543) Weight:  [87.6 kg (193 lb 2 oz)] 87.6 kg (193 lb 2 oz) (10/23 0543) Weight change: -3.119 kg (-6 lb 14 oz) Last BM Date: 10/24/11  Intake/Output from previous day: 10/22 0701 - 10/23 0700 In: 620 [P.O.:620] Out: 650 [Urine:650]  PHYSICAL EXAM General appearance: alert, cooperative and mild distress Resp: clear to auscultation bilaterally Cardio: regular rate and rhythm, S1, S2 normal, no murmur, click, rub or gallop GI: soft, non-tender; bowel sounds normal; no masses,  no organomegaly Extremities: extremities normal, atraumatic, no cyanosis or edema  Lab Results:    Basic Metabolic Panel:  Basename 10/24/11 1007  NA 140  K 3.6  CL 102  CO2 27  GLUCOSE 115*  BUN 12  CREATININE 0.82  CALCIUM 10.2  MG --  PHOS --   Liver Function Tests:  Basename 10/24/11 1007  AST 19  ALT 29  ALKPHOS 67  BILITOT 0.2*  PROT 8.0  ALBUMIN 4.6    Basename 10/24/11 1007  LIPASE 23  AMYLASE --   No results found for this basename: AMMONIA:2 in the last 72 hours CBC:  Basename 10/24/11 1007  WBC 10.3  NEUTROABS 8.3*  HGB 16.1  HCT 46.1  MCV 92.2  PLT 290   Cardiac Enzymes:  Basename 10/26/11 0404 10/25/11 1947 10/25/11 1153  CKTOTAL -- -- --  CKMB -- -- --  CKMBINDEX -- -- --  TROPONINI 2.37* 2.50* 2.45*   BNP: No results found for this basename: PROBNP:3 in the last 72 hours D-Dimer:  [facility] 10/24/11 1007  DDIMER <0.27   CBG: No results found for this basename: GLUCAP:6 in the last 72 hours Hemoglobin A1C: No results found for this basename: HGBA1C in the last 72 hours Fasting Lipid  Panel: No results found for this basename: CHOL,HDL,LDLCALC,TRIG,CHOLHDL,LDLDIRECT in the last 72 hours Thyroid Function Tests: No results found for this basename: TSH,T4TOTAL,FREET4,T3FREE,THYROIDAB in the last 72 hours Anemia Panel: No results found for this basename: VITAMINB12,FOLATE,FERRITIN,TIBC,IRON,RETICCTPCT in the last 72 hours Coagulation: No results found for this basename: LABPROT:2,INR:2 in the last 72 hours Urine Drug Screen: Drugs of Abuse     Component Value Date/Time   LABOPIA NONE DETECTED 10/24/2011 1007   COCAINSCRNUR NONE DETECTED 10/24/2011 1007   LABBENZ NONE DETECTED 10/24/2011 1007   AMPHETMU NONE DETECTED 10/24/2011 1007   THCU POSITIVE* 10/24/2011 1007   LABBARB NONE DETECTED 10/24/2011 1007    Alcohol Level: No results found for this basename: ETH:2 in the last 72 hours Urinalysis: No results found for this basename: COLORURINE:2,APPERANCEUR:2,LABSPEC:2,PHURINE:2,GLUCOSEU:2,HGBUR:2,BILIRUBINUR:2,KETONESUR:2,PROTEINUR:2,UROBILINOGEN:2,NITRITE:2,LEUKOCYTESUR:2 in the last 72 hours Misc. Labs:  ABGS No results found for this basename: PHART,PCO2,PO2ART,TCO2,HCO3 in the last 72 hours CULTURES Recent Results (from the past 240 hour(s))  MRSA PCR SCREENING     Status: Normal   Collection Time   10/24/11  2:30 PM      Component Value Range Status Comment   MRSA by PCR NEGATIVE  NEGATIVE Final    Studies/Results: X-ray Chest Pa And Lateral   10/25/2011  *RADIOLOGY REPORT*  Clinical Data: History of pleuritic pain.  CHEST - 2 VIEW  Comparison: 07/24/2010.  10/24/2011.  CT 10/24/2011.  Findings: There is stable normal cardiac size and shape. Mediastinal and hilar contours appear normal and stable.  There is slight flattening of the diaphragm on lateral image which may reflect minimal hyperinflation.  No pulmonary infiltrates or masses were evident. No pleural abnormality is evident.  No skeletal lesions are seen.  IMPRESSION: Slight flattening of diaphragm on  lateral image may reflect minimal hyperinflation.  No pulmonary infiltrates are seen.  No pleural disease is evident.  No evidence of pneumothorax.  No rib lesion is evident.   Original Report Authenticated By: Crawford Givens, M.D.    Ct Angio Chest W/cm &/or Wo Cm  10/24/2011  *RADIOLOGY REPORT*  Clinical Data: Chest pain and pressure radiating to back, started yesterday, shortness of breath, question pulmonary embolism  CT ANGIOGRAPHY CHEST  Technique:  Multidetector CT imaging of the chest using the standard protocol during bolus administration of intravenous contrast. Multiplanar reconstructed images including MIPs were obtained and reviewed to evaluate the vascular anatomy.  Contrast: OMNIPAQUE IOHEXOL 350 MG/ML SOLN  Comparison: None.  Findings: Aorta normal caliber without aneurysm or dissection. Mild residual thymic tissue anterior mediastinum. No thoracic adenopathy. Visualized portion of upper abdomen unremarkable.  Pulmonary arteries patent. No evidence of pulmonary embolism. Dependent atelectasis in posterior lower lobes bilaterally. No pulmonary infiltrate, pleural effusion or pneumothorax. Few tiny 1-2 mm diameter nonspecific questionable nodular foci in the upper lobes bilaterally. No acute osseous findings.  IMPRESSION: Mild dependent atelectasis in the posterior lower lobes bilaterally. No evidence of pulmonary embolism. Few tiny nonspecific 1-2 mm diameter nodular foci in bilateral upper lobes, recommendation below. If the patient is at high risk for bronchogenic carcinoma, follow- up chest CT at 1 year is recommended.  If the patient is at low risk, no follow-up is needed.  This recommendation follows the consensus statement: Guidelines for Management of Small Pulmonary Nodules Detected on CT Scans:  A Statement from the Fleischner Society as published in Radiology 2005; 237:395-400.   Original Report Authenticated By: Lollie Marrow, M.D.    Dg Chest Portable 1 View  10/24/2011   *RADIOLOGY REPORT*  Clinical Data: Chest and right arm pain, smoker  PORTABLE CHEST - 1 VIEW  Comparison: Portable exam 1019 hours compared to 07/24/2010  Findings: Normal heart size, mediastinal contours, and pulmonary vascularity. Lungs clear. No pleural effusion or pneumothorax. Bones unremarkable.  IMPRESSION: No acute abnormalities.   Original Report Authenticated By: Lollie Marrow, M.D.     Medications:  Scheduled:   . buprenorphine-naloxone  1 tablet Sublingual TID  . enoxaparin (LOVENOX) injection  40 mg Subcutaneous Q24H  . indomethacin  25 mg Oral TID WC  . nicotine  21 mg Transdermal Daily  . sertraline  50 mg Oral Daily  . sodium chloride  3 mL Intravenous Q12H  . sodium chloride       Continuous:  ZOX:WRUEAV chloride, acetaminophen, alum & mag hydroxide-simeth, LORazepam, ondansetron (ZOFRAN) IV, ondansetron, sodium chloride, traZODone  Assesment: He has chest pain and elevated troponin level. His echocardiogram was normal. He has no other complaints except for headache Active Problems:  * No active hospital problems. *     Plan: he is supposed to have stress test today.    LOS: 2 days   Marik Sedore L 10/26/2011, 8:50 AM

## 2011-10-26 NOTE — Care Management Note (Signed)
    Page 1 of 1   10/26/2011     1:58:07 PM   CARE MANAGEMENT NOTE 10/26/2011  Patient:  Daniel Hays, Daniel Hays   Account Number:  [id number]  Date Initiated:  10/26/2011  Documentation initiated by:  Sharrie Rothman  Subjective/Objective Assessment:   Pt admitted from home with CP. Pt lives with significant other and will return home at discharge. Pt is independent with ADL's.     Action/Plan:   No CM or HH need snoted.   Anticipated DC Date:  10/26/2011   Anticipated DC Plan:  HOME/SELF CARE      DC Planning Services  CM consult      Choice offered to / List presented to:             Status of service:  Completed, signed off Medicare Important Message given?   (If response is "NO", the following Medicare IM given date fields will be blank) Date Medicare IM given:   Date Additional Medicare IM given:    Discharge Disposition:  HOME/SELF CARE  Per UR Regulation:    If discussed at Long Length of Stay Meetings, dates discussed:    Comments:  10/26/11 1357 Arlyss Queen, RN BSN CM

## 2011-10-26 NOTE — Progress Notes (Signed)
Patient ID: Daniel Hays, male   DOB: 27-Jan-1980, 31 y.o.   MRN: [id number]   The nuclear stress test has been completed. I have reviewed completely. The exercise component is normal. There is no EKG change. The nuclear images are normal. There is no sign of scar or ischemia. Cardiac workup is complete. There is no evidence of significant cardiac ischemia at this time.  Jerral Bonito, MD

## 2011-10-26 NOTE — Progress Notes (Signed)
Stress Lab Nurses Notes - Daniel Hays  Daniel Hays 10/26/2011 Reason for doing test: Chest Pain and Dyspnea Type of test: Stress Cardiolite / Inpatient Rm 303 Nurse performing test: Parke Poisson, RN Nuclear Medicine Tech: Lou Cal Echo Tech: Not Applicable MD performing test: Dr. Myrtis Ser Family MD: Dr. Juanetta Gosling Test explained and consent signed: yes IV started: Saline lock flushed, No redness or edema and Saline lock from floor Symptoms: Fatigue Treatment/Intervention: None Reason test stopped: fatigue After recovery IV was: No redness or edema and Saline Lock flushed Patient to return to Nuc. Med at : 12:45 Patient discharged: Transported back to room 303 via WC Patient's Condition upon discharge was: stable Comments: During test peak BP 178/76 & HR 166.  Recovery BP 123/81 & HR 88.  Symptoms resolved in recovery. [name] T

## 2011-10-26 NOTE — Progress Notes (Signed)
Patient received discharge instructions along with follow up appointments. Patient verbalized understanding of instructions. Patient was escorted by staff to vehicle. Patient discharged to home in stable condition.

## 2011-10-27 ENCOUNTER — Other Ambulatory Visit (HOSPITAL_COMMUNITY): Payer: Self-pay | Admitting: Pulmonary Disease

## 2011-10-29 NOTE — Discharge Summary (Signed)
Physician Discharge Summary  Patient ID: Daniel Hays MRN: [id number] DOB/AGE: 1980/10/19 31 y.o. Primary Care Physician:Rykker Coviello L, MD Admit date: 10/24/2011 Discharge date: 10/29/2011    Discharge Diagnoses:   Active Problems:  * No active hospital problems. *   chest pain myocardial infarction ruled out History of narcotic abuse Depression    Medication List     As of 10/29/2011  9:32 AM    TAKE these medications         sertraline 50 MG tablet   Commonly known as: ZOLOFT   Take 1 tablet (50 mg total) by mouth daily.      SUBOXONE 8-2 MG Film   Generic drug: Buprenorphine HCl-Naloxone HCl   Place 1 each under the tongue 3 (three) times daily.        Discharged Condition: Improved   Consults: Cardiology  Significant Diagnostic Studies: X-ray Chest Pa And Lateral   10/25/2011  *RADIOLOGY REPORT*  Clinical Data: History of pleuritic pain.  CHEST - 2 VIEW  Comparison: 07/24/2010.  10/24/2011.  CT 10/24/2011.  Findings: There is stable normal cardiac size and shape. Mediastinal and hilar contours appear normal and stable.  There is slight flattening of the diaphragm on lateral image which may reflect minimal hyperinflation.  No pulmonary infiltrates or masses were evident. No pleural abnormality is evident.  No skeletal lesions are seen.  IMPRESSION: Slight flattening of diaphragm on lateral image may reflect minimal hyperinflation.  No pulmonary infiltrates are seen.  No pleural disease is evident.  No evidence of pneumothorax.  No rib lesion is evident.   Original Report Authenticated By: Crawford Givens, M.D.    Ct Angio Chest W/cm &/or Wo Cm  10/24/2011  *RADIOLOGY REPORT*  Clinical Data: Chest pain and pressure radiating to back, started yesterday, shortness of breath, question pulmonary embolism  CT ANGIOGRAPHY CHEST  Technique:  Multidetector CT imaging of the chest using the standard protocol during bolus administration of intravenous contrast. Multiplanar  reconstructed images including MIPs were obtained and reviewed to evaluate the vascular anatomy.  Contrast: OMNIPAQUE IOHEXOL 350 MG/ML SOLN  Comparison: None.  Findings: Aorta normal caliber without aneurysm or dissection. Mild residual thymic tissue anterior mediastinum. No thoracic adenopathy. Visualized portion of upper abdomen unremarkable.  Pulmonary arteries patent. No evidence of pulmonary embolism. Dependent atelectasis in posterior lower lobes bilaterally. No pulmonary infiltrate, pleural effusion or pneumothorax. Few tiny 1-2 mm diameter nonspecific questionable nodular foci in the upper lobes bilaterally. No acute osseous findings.  IMPRESSION: Mild dependent atelectasis in the posterior lower lobes bilaterally. No evidence of pulmonary embolism. Few tiny nonspecific 1-2 mm diameter nodular foci in bilateral upper lobes, recommendation below. If the patient is at high risk for bronchogenic carcinoma, follow- up chest CT at 1 year is recommended.  If the patient is at low risk, no follow-up is needed.  This recommendation follows the consensus statement: Guidelines for Management of Small Pulmonary Nodules Detected on CT Scans:  A Statement from the Fleischner Society as published in Radiology 2005; 237:395-400.   Original Report Authenticated By: Lollie Marrow, M.D.    Nm Myocar Single W/spect W/wall Motion And Ef  10/26/2011  *RADIOLOGY REPORT*  Clinical Data:  The patient was admitted with chest pain.  The study is done for further evaluation.  MYOCARDIAL IMAGING WITH SPECT (REST AND EXERCISE) GATED LEFT VENTRICULAR WALL MOTION STUDY LEFT VENTRICULAR EJECTION FRACTION  Technique:  Standard myocardial SPECT imaging was performed after resting intravenous injection of   mCi Tc-58m  tetrofosmin/sestamibi. Subsequently, exercise tolerance test was performed by the patient under the supervision of the Cardiology staff.  At peak-stress,  mCi Tc-9m tetrofosmin/sestamibiwas injected intravenously and  standard myocardial SPECT imaging was performed.  Quantitative gated imaging was also performed to evaluate left ventricular wall motion, and estimate left ventricular ejection fraction.  Comparison:  None.  Findings: The patient exercised well on the treadmill.  He walk for 12 minutes and 15 seconds.  Peak heart rate was 166 representing 88% predicted maximum heart rate.  There is no chest pain.  There was no EKG change.  There was no ectopy.  The nuclear raw data revealed no evidence of excess motion.  With stress tomographic images reveal normal uptake in all segments.  On the rest images there is some interference from nuclear activity from structures below the diaphragm.  Wall motion analysis is normal.  The ejection fraction is 56%.  IMPRESSION: The stress images are completely normal.  This rules out the possibility of scar or ischemia.  Wall motion is normal.  This is a normal stress nuclear scan.  There is no scar or ischemia.   Original Report Authenticated By: [email or web address]    Dg Chest Portable 1 View  10/24/2011  *RADIOLOGY REPORT*  Clinical Data: Chest and right arm pain, smoker  PORTABLE CHEST - 1 VIEW  Comparison: Portable exam 1019 hours compared to 07/24/2010  Findings: Normal heart size, mediastinal contours, and pulmonary vascularity. Lungs clear. No pleural effusion or pneumothorax. Bones unremarkable.  IMPRESSION: No acute abnormalities.   Original Report Authenticated By: Lollie Marrow, M.D.     Lab Results: Basic Metabolic Panel: No results found for this basename: NA:2,K:2,CL:2,CO2:2,GLUCOSE:2,BUN:2,CREATININE:2,CALCIUM:2,MG:2,PHOS:2 in the last 72 hours Liver Function Tests: No results found for this basename: AST:2,ALT:2,ALKPHOS:2,BILITOT:2,PROT:2,ALBUMIN:2 in the last 72 hours   CBC: No results found for this basename: WBC:2,NEUTROABS:2,HGB:2,HCT:2,MCV:2,PLT:2 in the last 72 hours  Recent Results (from the past 240 hour(s))  MRSA PCR SCREENING     Status: Normal   Collection  Time   10/24/11  2:30 PM      Component Value Range Status Comment   MRSA by PCR NEGATIVE  NEGATIVE Final      Hospital Course: He came to the emergency room with chest pain. It was somewhat pleuritic in nature but he had an elevated troponin level. This was repeated and was positive on multiple occasions. His electrocardiogram did not show any definite abnormalities. He was treated with pain medications given IV fluids and had cardiology consultation. Echocardiogram was normal. He had stress test was normal. It was felt that he did not have a cardiac cause of his chest pain. His d-dimer was normal and chest CT did not show evidence of pulmonary embolus. Since pericarditis myocardial infarction and pulmonary embolism were ruled out he was discharged home in improved condition  Discharge Exam: Blood pressure 110/73, pulse 58, temperature 97.9 F (36.6 C), temperature source Oral, resp. rate 18, height 6\' 1"  (1.854 m), weight 87.6 kg (193 lb 2 oz), SpO2 97.00%. He is awake and alert. His chest is clear. His heart is regular.  Disposition: Home      Discharge Orders    Future Orders Please Complete By Expires   Discharge patient           Signed: Fredirick Maudlin Pager [phone]  10/29/2011, 9:32 AM

## 2012-02-26 ENCOUNTER — Emergency Department (HOSPITAL_COMMUNITY): Payer: 59

## 2012-02-26 ENCOUNTER — Emergency Department (HOSPITAL_COMMUNITY)
Admission: EM | Admit: 2012-02-26 | Discharge: 2012-02-26 | Disposition: A | Discharge status: Home / Self Care | Payer: 59 | Attending: Emergency Medicine | Admitting: Emergency Medicine

## 2012-02-26 ENCOUNTER — Encounter (HOSPITAL_COMMUNITY): Payer: Self-pay | Admitting: *Deleted

## 2012-02-26 DIAGNOSIS — Y929 Unspecified place or not applicable: Secondary | ICD-10-CM | POA: Insufficient documentation

## 2012-02-26 DIAGNOSIS — Y939 Activity, unspecified: Secondary | ICD-10-CM | POA: Insufficient documentation

## 2012-02-26 DIAGNOSIS — IMO0001 Reserved for inherently not codable concepts without codable children: Secondary | ICD-10-CM | POA: Insufficient documentation

## 2012-02-26 DIAGNOSIS — Z7982 Long term (current) use of aspirin: Secondary | ICD-10-CM | POA: Insufficient documentation

## 2012-02-26 DIAGNOSIS — F17200 Nicotine dependence, unspecified, uncomplicated: Secondary | ICD-10-CM | POA: Insufficient documentation

## 2012-02-26 DIAGNOSIS — S6980XA Other specified injuries of unspecified wrist, hand and finger(s), initial encounter: Secondary | ICD-10-CM | POA: Insufficient documentation

## 2012-02-26 DIAGNOSIS — S6990XA Unspecified injury of unspecified wrist, hand and finger(s), initial encounter: Secondary | ICD-10-CM | POA: Insufficient documentation

## 2012-02-26 DIAGNOSIS — S6700XA Crushing injury of unspecified thumb, initial encounter: Secondary | ICD-10-CM

## 2012-02-26 DIAGNOSIS — W230XXA Caught, crushed, jammed, or pinched between moving objects, initial encounter: Secondary | ICD-10-CM | POA: Insufficient documentation

## 2012-02-26 MED ORDER — HYDROCODONE-ACETAMINOPHEN 5-325 MG PO TABS: 2.0000 | ORAL_TABLET | ORAL | Status: DC | PRN

## 2012-02-26 MED ORDER — HYDROCODONE-ACETAMINOPHEN 5-325 MG PO TABS
2.0000 | ORAL_TABLET | Freq: Once | ORAL | Status: AC
  Administered 2012-02-26: 2 via ORAL
  Filled 2012-02-26: qty 1

## 2012-02-26 NOTE — ED Provider Notes (Signed)
History     CSN: [id number]  Arrival date & time 02/26/12  2144   First MD Initiated Contact with Patient 02/26/12 2235      Chief Complaint  Patient presents with  . Finger Injury    (Consider location/radiation/quality/duration/timing/severity/associated sxs/prior treatment) Patient is a 32 y.o. male presenting with hand pain. The history is provided by the patient. No language interpreter was used.  Hand Pain This is a new problem. The current episode started today. The problem occurs constantly. The problem has been gradually worsening. Associated symptoms include myalgias.    History reviewed. No pertinent past medical history.  Past Surgical History  Procedure Laterality Date  . Appendectomy      History reviewed. No pertinent family history.  History  Substance Use Topics  . Smoking status: Current Every Day Smoker -- 1.00 packs/day    Types: Cigarettes  . Smokeless tobacco: Not on file  . Alcohol Use: Yes     Comment: occ use      Review of Systems  Musculoskeletal: Positive for myalgias.  Skin: Positive for wound.  All other systems reviewed and are negative.    Allergies  Review of patient's allergies indicates no known allergies.  Home Medications   Current Outpatient Rx  Name  Route  Sig  Dispense  Refill  . Aspirin-Salicylamide-Caffeine (BC HEADACHE) 325-95-16 MG TABS   Oral   Take 1 packet by mouth daily as needed (FOR PAIN).         . Buprenorphine HCl-Naloxone HCl (SUBOXONE) 8-2 MG FILM   Sublingual   Place 1 each under the tongue 3 (three) times daily.           BP 121/68  Pulse 100  Temp(Src) 98.2 F (36.8 C) (Oral)  Resp 20  Ht 6' (1.829 m)  Wt 210 lb (95.255 kg)  BMI 28.47 kg/m2  SpO2 97%  Physical Exam  Nursing note and vitals reviewed. Constitutional: He is oriented to person, place, and time. He appears well-developed and well-nourished.  Musculoskeletal: He exhibits tenderness.  Swollen bruised left thumb,    Bruised lower 4th of nail,   Neurological: He is alert and oriented to person, place, and time. He has normal reflexes.  Skin: Skin is warm.  Psychiatric: He has a normal mood and affect.    ED Course  Procedures (including critical care time)  Labs Reviewed - No data to display Dg Finger Thumb Left  02/26/2012  *RADIOLOGY REPORT*  Clinical Data: The patient slammed finger in car door.  Laceration and distal pain.  LEFT THUMB 2+V  Comparison: None.  Findings: Left first finger appears intact.  No evidence of acute fracture or subluxation.  No focal bone lesion or bone destruction. Bone cortex and trabecular architecture appears intact.  No radiopaque soft tissue foreign bodies.  IMPRESSION: No acute bony abnormalities demonstrated in the left first finger.   Original Report Authenticated By: Burman Nieves, M.D.      No diagnosis found.    MDM  Clean bandage,  Hydrocodone,         Lonia Skinner [place], [place] 02/26/12 2247

## 2012-02-26 NOTE — ED Notes (Signed)
Pt slammed left thumb in car door

## 2012-02-26 NOTE — ED Provider Notes (Signed)
Medical screening examination/treatment/procedure(s) were performed by non-physician practitioner and as supervising physician I was immediately available for consultation/collaboration.   Benny Lennert, MD 02/26/12 [phone]

## 2012-10-01 ENCOUNTER — Emergency Department (HOSPITAL_COMMUNITY)
Admission: EM | Admit: 2012-10-01 | Discharge: 2012-10-01 | Discharge status: Against Medical Advice | Payer: 59 | Attending: Emergency Medicine | Admitting: Emergency Medicine

## 2012-10-01 DIAGNOSIS — F17200 Nicotine dependence, unspecified, uncomplicated: Secondary | ICD-10-CM | POA: Insufficient documentation

## 2012-10-01 DIAGNOSIS — M25549 Pain in joints of unspecified hand: Secondary | ICD-10-CM | POA: Insufficient documentation

## 2012-10-02 ENCOUNTER — Encounter (HOSPITAL_COMMUNITY): Payer: Self-pay | Admitting: Emergency Medicine

## 2012-10-02 ENCOUNTER — Encounter (HOSPITAL_COMMUNITY): Payer: Self-pay | Admitting: *Deleted

## 2012-10-02 ENCOUNTER — Emergency Department (HOSPITAL_COMMUNITY): Payer: 59

## 2012-10-02 ENCOUNTER — Emergency Department (HOSPITAL_COMMUNITY)
Admission: EM | Admit: 2012-10-02 | Discharge: 2012-10-02 | Disposition: A | Discharge status: Home / Self Care | Payer: 59 | Attending: Emergency Medicine | Admitting: Emergency Medicine

## 2012-10-02 ENCOUNTER — Emergency Department (HOSPITAL_COMMUNITY)
Admission: EM | Admit: 2012-10-02 | Discharge: 2012-10-02 | Disposition: A | Discharge status: Other Acute Inpatient Hospital | Payer: 59 | Source: Home / Self Care | Attending: Emergency Medicine | Admitting: Emergency Medicine

## 2012-10-02 DIAGNOSIS — S62319A Displaced fracture of base of unspecified metacarpal bone, initial encounter for closed fracture: Secondary | ICD-10-CM | POA: Insufficient documentation

## 2012-10-02 DIAGNOSIS — Y9389 Activity, other specified: Secondary | ICD-10-CM | POA: Insufficient documentation

## 2012-10-02 DIAGNOSIS — Y929 Unspecified place or not applicable: Secondary | ICD-10-CM | POA: Insufficient documentation

## 2012-10-02 DIAGNOSIS — IMO0002 Reserved for concepts with insufficient information to code with codable children: Secondary | ICD-10-CM | POA: Insufficient documentation

## 2012-10-02 DIAGNOSIS — S62233A Other displaced fracture of base of first metacarpal bone, unspecified hand, initial encounter for closed fracture: Secondary | ICD-10-CM | POA: Insufficient documentation

## 2012-10-02 DIAGNOSIS — Z7982 Long term (current) use of aspirin: Secondary | ICD-10-CM | POA: Insufficient documentation

## 2012-10-02 DIAGNOSIS — W2209XA Striking against other stationary object, initial encounter: Secondary | ICD-10-CM | POA: Insufficient documentation

## 2012-10-02 DIAGNOSIS — F17200 Nicotine dependence, unspecified, uncomplicated: Secondary | ICD-10-CM | POA: Insufficient documentation

## 2012-10-02 DIAGNOSIS — Z79899 Other long term (current) drug therapy: Secondary | ICD-10-CM | POA: Insufficient documentation

## 2012-10-02 MED ORDER — FENTANYL CITRATE 0.05 MG/ML IJ SOLN
50.0000 ug | Freq: Once | INTRAMUSCULAR | Status: AC
  Administered 2012-10-02: 50 ug via INTRAVENOUS
  Filled 2012-10-02: qty 2

## 2012-10-02 MED ORDER — FENTANYL CITRATE 0.05 MG/ML IJ SOLN
50.0000 ug | Freq: Once | INTRAMUSCULAR | Status: AC
  Administered 2012-10-02: 50 ug via INTRAVENOUS

## 2012-10-02 MED ORDER — OXYCODONE HCL 5 MG PO TABS: 5.0000 mg | ORAL_TABLET | Freq: Once | ORAL | Status: DC

## 2012-10-02 MED ORDER — OXYCODONE-ACETAMINOPHEN 5-325 MG PO TABS
2.0000 | ORAL_TABLET | Freq: Once | ORAL | Status: AC
  Administered 2012-10-02: 2 via ORAL
  Filled 2012-10-02: qty 2

## 2012-10-02 NOTE — ED Notes (Signed)
Pt sent here from [facility] for hand injury, punched a wall last night and has right hand injury, splint applied pta. Pt sent here to see dr Amanda Pea. No acute distress noted on arrival, able to move digits.

## 2012-10-02 NOTE — ED Provider Notes (Signed)
CSN: [id number]     Arrival date & time 10/02/12  1519 History  This chart was scribed for non-physician practitioner, Felicie Morn, NP, working with Toy Baker, MD by Ronal Fear, ED scribe. This patient was seen in room TR11C/TR11C and the patient's care was started at 3:35 PM.     Chief Complaint  Patient presents with  . Hand Injury    Patient is a 32 y.o. male presenting with hand injury. The history is provided by the patient. No language interpreter was used.  Hand Injury Location:  Hand Time since incident:  10 hours Injury: no   Hand location:  R hand Pain details:    Radiates to:  Does not radiate Chronicity:  New Relieved by:  Narcotics Associated symptoms: numbness and swelling     Pt hit a wall last night and injured his right hand. He was seen at [facility] and a splint was applied. Pt was sent to [facility] for a follow up with Dr. Amanda Pea.  Pt was given percocet's for pain. eh does not appear to be in any accute distress with no other complaints   History reviewed. No pertinent past medical history. Past Surgical History  Procedure Laterality Date  . Appendectomy     History reviewed. No pertinent family history. History  Substance Use Topics  . Smoking status: Current Every Day Smoker -- 1.00 packs/day    Types: Cigarettes  . Smokeless tobacco: Not on file  . Alcohol Use: Yes     Comment: occ use    Review of Systems  Musculoskeletal: Positive for arthralgias.  All other systems reviewed and are negative.    Allergies  Review of patient's allergies indicates no known allergies.  Home Medications   Current Outpatient Rx  Name  Route  Sig  Dispense  Refill  . Aspirin-Salicylamide-Caffeine (BC HEADACHE) 325-95-16 MG TABS   Oral   Take 1 packet by mouth daily as needed (FOR PAIN).          BP 134/80  Pulse 101  Temp(Src) 98.1 F (36.7 C) (Oral)  Resp 20  SpO2 98% Physical Exam  Nursing note and vitals reviewed. Constitutional: He is oriented  to person, place, and time. He appears well-developed and well-nourished. No distress.  HENT:  Head: Normocephalic and atraumatic.  Eyes: EOM are normal.  Neck: Neck supple. No tracheal deviation present.  Cardiovascular: Normal rate.   Pulmonary/Chest: Effort normal. No respiratory distress.  Musculoskeletal: Normal range of motion.  Good distal sensation. numbness in 4th and 5th finger. Capillary refill <3 s. ROm is present, hindered by splint.  Neurological: He is alert and oriented to person, place, and time.  Skin: Skin is warm and dry.  Psychiatric: He has a normal mood and affect. His behavior is normal.    ED Course  Procedures (including critical care time)  DIAGNOSTIC STUDIES: Oxygen Saturation is 98% on RA, normal by my interpretation.    COORDINATION OF CARE: 3:42 PM- Pt advised of plan for treatment including seeing Dr. Amanda Pea, the hand specialist and pt agrees.   Labs Review Labs Reviewed - No data to display Imaging Review Dg Hand Complete Right  10/02/2012   CLINICAL DATA:  Right hand pain and swelling following punching injury  EXAM: RIGHT HAND - COMPLETE 3+ VIEW  COMPARISON:  None.  FINDINGS: There is a comminuted fracture through the base of the 5th metacarpal involving the articular surface with the underlying carpal bones. Generalized soft tissue swelling is noted. No  other fracture is seen. Mild angulation at the fracture site is noted.  IMPRESSION: Comminuted fracture through the base of the 5th metacarpal.   Electronically Signed   By: Alcide Clever   On: 10/02/2012 11:59   Contacted Dr. Carlos Levering office to notify him of patient's arrival in ED.  Office staff will page.  Patient seen by Dr. Amanda Pea, who has provided patient with prescriptions and scheduled follow-up in the office on Thursday.  Patient may be discharged home once CT of hand is completed. MDM  Metacarpal (5th) fracture right hand.  I personally performed the services described in this  documentation, which was scribed in my presence. The recorded information has been reviewed and is accurate.    Jimmye Norman, NP 10/03/12 [phone]

## 2012-10-02 NOTE — ED Provider Notes (Signed)
CSN: [id number]     Arrival date & time 10/02/12  1108 History   First MD Initiated Contact with Patient 10/02/12 1134     Chief Complaint  Patient presents with  . Hand Injury   (Consider location/radiation/quality/duration/timing/severity/associated sxs/prior Treatment) Patient is a 32 y.o. male presenting with hand injury. The history is provided by the patient.  Hand Injury Location:  Hand Injury: yes   Mechanism of injury comment:  Hit a wall with fist Hand location:  R hand Pain details:    Quality:  Throbbing and sharp   Radiates to:  Does not radiate   Severity:  Severe   Onset quality:  Sudden   Duration:  12 hours   Timing:  Constant   Progression:  Worsening Chronicity:  New Handedness:  Right-handed Dislocation: no   Foreign body present:  No foreign bodies Prior injury to area:  No Relieved by:  Nothing Worsened by:  Movement Associated symptoms: no fever and no neck pain    Daniel Hays is a 32 y.o. male who presents to the ED with right hand pain after punching a wall last night. States that when he went to bed it just hurt but this morning was really swollen and the pain is severe.  History reviewed. No pertinent past medical history. Past Surgical History  Procedure Laterality Date  . Appendectomy     History reviewed. No pertinent family history. History  Substance Use Topics  . Smoking status: Current Every Day Smoker -- 1.00 packs/day    Types: Cigarettes  . Smokeless tobacco: Not on file  . Alcohol Use: Yes     Comment: occ use    Review of Systems  Constitutional: Negative for fever and chills.  HENT: Negative for neck pain.   Eyes: Negative for visual disturbance.  Respiratory: Negative for shortness of breath.   Gastrointestinal: Negative for nausea and vomiting.  Musculoskeletal:       Right hand pain, swelling.  Skin: Wound: small abrasion to hand.  Allergic/Immunologic: Negative for immunocompromised state.  Neurological: Negative  for light-headedness and headaches.  Psychiatric/Behavioral: Negative for confusion. The patient is not nervous/anxious.     Allergies  Review of patient's allergies indicates no known allergies.  Home Medications   Current Outpatient Rx  Name  Route  Sig  Dispense  Refill  . Aspirin-Salicylamide-Caffeine (BC HEADACHE) 325-95-16 MG TABS   Oral   Take 1 packet by mouth daily as needed (FOR PAIN).         . Buprenorphine HCl-Naloxone HCl (SUBOXONE) 8-2 MG FILM   Sublingual   Place 1 each under the tongue 3 (three) times daily.         Marland Kitchen HYDROcodone-acetaminophen (NORCO/VICODIN) 5-325 MG per tablet   Oral   Take 2 tablets by mouth every 4 (four) hours as needed for pain.   10 tablet   0    BP 128/90  Pulse 93  Temp(Src) 98.3 F (36.8 C)  Resp 18  SpO2 100% Physical Exam  Nursing note and vitals reviewed. Constitutional: He is oriented to person, place, and time. He appears well-developed and well-nourished. No distress.  HENT:  Head: Normocephalic and atraumatic.  Eyes: EOM are normal.  Neck: Normal range of motion. Neck supple.  Cardiovascular: Normal rate.   Pulmonary/Chest: Effort normal.  Musculoskeletal:       Right hand: He exhibits decreased range of motion, tenderness, bony tenderness and swelling. He exhibits normal capillary refill. Normal sensation noted.  Patient's entire  right hand with swelling and tenderness. There is a small abrasion noted to the dorsum of the hand but no signs of infection. Radial pulse is presents with adequate circulation at this time. Difficulty with flexion and extension of the fingers due to swelling.   Neurological: He is alert and oriented to person, place, and time. No cranial nerve deficit.  Skin: Skin is warm and dry.  Psychiatric: He has a normal mood and affect. His behavior is normal.    ED Course: Dr. Deretha Emory in to examine the patient. I will consult with ortho on call for hand.  Procedures Dg Hand Complete  Right  10/02/2012   CLINICAL DATA:  Right hand pain and swelling following punching injury  EXAM: RIGHT HAND - COMPLETE 3+ VIEW  COMPARISON:  None.  FINDINGS: There is a comminuted fracture through the base of the 5th metacarpal involving the articular surface with the underlying carpal bones. Generalized soft tissue swelling is noted. No other fracture is seen. Mild angulation at the fracture site is noted.  IMPRESSION: Comminuted fracture through the base of the 5th metacarpal.   Electronically Signed   By: Alcide Clever   On: 10/02/2012 11:59    MDM: spoke with Dr. Amanda Pea and he will see the patient in the [facility], patient's wife to take him by private car. Patient stable for transfer with out any immediate complications. Patient remains neurovascularly intact.   32 y.o. male with fracture of the 5th [facility] s/p injury last night. Swelling and pain to the right hand. Patient to [facility] via private car to be evaluated by Dr. Amanda Pea. Volar splint applied prior to d/c. Ice pack applied and hand elevated. Patient treated for pain with Percocet.     Daniel Hays, [place] 10/02/12 1327

## 2012-10-02 NOTE — ED Notes (Signed)
Pt presents with swelling and pain to right hand. Pt states he punched a wall last night causing the injury. Limited movement in hand and fingers.

## 2012-10-02 NOTE — ED Notes (Signed)
PT STATES HE HAD A CHEESEBURGER ON THE WAY FROM [facility] TO [facility].

## 2012-10-02 NOTE — ED Notes (Signed)
Dr. Amanda Pea paged by triage staff.

## 2012-10-02 NOTE — ED Notes (Signed)
Dr Gramig at bedside. 

## 2012-10-02 NOTE — ED Provider Notes (Signed)
Medical screening examination/treatment/procedure(s) were conducted as a shared visit with non-physician practitioner(s) and myself.  I personally evaluated the patient during the encounter  Patient with fracture at the base of the fifth metacarpal. Patient has marketed hand swelling dorsal and on the palm surface some concern perhaps for developing compartment syndrome currently could Refill to all nailbeds good range of motion of left thumb and index finger without significant pain but all the other fingers cause significant pain. Discussed with on-call hand surgery concur with their assessment to evaluate him. Patient will be transferred for evaluation.  Shelda Jakes, MD 10/02/12 1440

## 2012-10-02 NOTE — ED Notes (Signed)
Dr. Carlos Levering PA, Arlys John at bedside.

## 2012-10-03 NOTE — Consult Note (Signed)
NAMEANUJ, SUMMONS NO.:  [id number]  MEDICAL RECORD NO.:  [id number]  LOCATION:  TR11C                        FACILITY:  [facility]  PHYSICIAN:  Dionne Ano. Aliayah Tyer, M.D.DATE OF BIRTH:  07/16/1980  DATE OF CONSULTATION:  10/02/2012 DATE OF DISCHARGE:  10/02/2012                                CONSULTATION   HISTORY OF PRESENT ILLNESS:  Daniel Hays is a 32 year old male who presents for evaluation of his right hand.  The patient injured the right hand last night, when he hit a wall quite violently.  He sustained a fracture to the base of his fifth CMC joint, right hand.  He was noted to have waited in the [facility], but states that the computer system was down and the wait was long, so he went home and slept.  He re-presented again today to the emergency room, given the increased swelling and pain in his hand.  Given the patient's swelling, I was asked to see him and thus he was transferred down to [facility] by private vehicle (his wife) for evaluation.  At the present time, he is sensate.  He denies numbness or tingling.  He denies locking, popping, or catching.  He states it is difficult to move his fingers, but he cannot.  I have reviewed this at length and the findings.  He is here today with his wife.  PAST MEDICAL HISTORY:  Reviewed.  PAST SURGICAL HISTORY:  Reviewed.  ALLERGIES:  None.  MEDICINES:  None.  SOCIAL HISTORY:  Reviewed at length in his chart.  PHYSICAL EXAMINATION:  GENERAL:  A white male alert and oriented, in no acute distress.  He is alert and oriented.  He has no signs of infection, dystrophy, or vascular compromise.  He is stable about the elbow and shoulder. HEENT:  Within normal limits. CHEST:  Clear. LOWER EXTREMITY:  Benign. PELVIS:  Stable. NECK AND BACK:  Nontender. MUSCULOSKELETAL:  Left upper extremity is neurovascularly intact.  His right upper extremity has tremendous swelling over the hand.  There is no  evidence of vascular compromise.  He is fairly nontender on passive extension when I examined him, but he does complain of pain in the hand in general.  He can flex the fingers and mildly extend them.  He is sensate, has good refill.  Thenar and mid palmar soft.  His hypothenar is swollen, but there is no tense compartments.  Forearm is nontender.  IMAGING DATA:  X-ray show a comminuted fracture at the fifth CMC base, right hand.  I have reviewed this with him at length and the findings.  At present juncture, the patient does not have any evidence of dystrophic reaction and does not have any evidence of obvious advancing compartment syndrome.  However, he does have impressive swelling.  IMPRESSION:  Comminuted fifth carpometacarpal fracture.  PLAN:  I would recommend CT scan.  I would recommend proper immobilization, splinting, and elevation.  We have given him oxycodone 5 mg 1 to 2 q.4-6 hours p.r.n. pain p.o.  In addition to this, I have given him Robaxin p.r.n. spasm.  I have ordered a scan of his affected fracture to look a  little bit more closely and clearly at the area and make sure he does not have any type of advanced dislocation.  He had a CT scan reviewed I have reviewed this CT scan, it shows a T- condylar type fracture about the area in question.  The coronal views and sagittal views are both reviewed in great detail.  I should note, he does have a fairly comminuted area in my estimation.  Nevertheless, there is no evidence of infection, and surprisingly enough he is not subluxed out of his joint.  I will continue to follow this quite closely.  I have discussed with the patient that he absolutely should not attempt to work.  We are going to continue the Robaxin as well as the pain management in the form of oxycodone, vitamin C, Peri-Colace, and other measures will be adhered to.  I am going to see him back in the office on Thursday at 10:00 a.m., and make sure that  things are moving accordingly for him in terms of his progress.  If he has any acute worsening, he and his wife understand that I would like to see him immediately.  Although, he does not have signs of an advanced compartment syndrome at present time, this is certainly a concern for [place] and we would want to make sure that he does not worsen in our estimation.  These notes have been discussed. All questions have been encouraged and answered.  If any issues arise, he understands to call our office immediately.     Dionne Ano. Amanda Pea, M.D.     [facility]  D:  10/02/2012  T:  10/03/2012  Job:  [id number]

## 2012-10-05 NOTE — ED Provider Notes (Signed)
Medical screening examination/treatment/procedure(s) were performed by non-physician practitioner and as supervising physician I was immediately available for consultation/collaboration.  Toy Baker, MD 10/05/12 [phone]

## 2012-10-12 ENCOUNTER — Encounter (HOSPITAL_COMMUNITY): Payer: Self-pay | Admitting: Pharmacy Technician

## 2012-10-12 ENCOUNTER — Other Ambulatory Visit: Payer: Self-pay | Admitting: Orthopedic Surgery

## 2012-10-12 ENCOUNTER — Encounter (HOSPITAL_COMMUNITY): Payer: Self-pay | Admitting: *Deleted

## 2012-10-12 NOTE — Progress Notes (Signed)
Pt denies SOB, chest pain, and being under the care of a cardiologist.  

## 2012-10-13 ENCOUNTER — Encounter (HOSPITAL_COMMUNITY): Payer: Self-pay | Admitting: *Deleted

## 2012-10-13 ENCOUNTER — Observation Stay (HOSPITAL_COMMUNITY)
Admission: RE | Admit: 2012-10-13 | Discharge: 2012-10-14 | Disposition: A | Discharge status: Home / Self Care | Payer: 59 | Source: Ambulatory Visit | Attending: Orthopedic Surgery | Admitting: Orthopedic Surgery

## 2012-10-13 ENCOUNTER — Ambulatory Visit (HOSPITAL_COMMUNITY): Payer: 59 | Admitting: Anesthesiology

## 2012-10-13 ENCOUNTER — Encounter (HOSPITAL_COMMUNITY): Payer: 59 | Admitting: Anesthesiology

## 2012-10-13 ENCOUNTER — Encounter (HOSPITAL_COMMUNITY)
Admission: RE | Disposition: A | Discharge status: Home / Self Care | Payer: Self-pay | Source: Ambulatory Visit | Attending: Orthopedic Surgery

## 2012-10-13 DIAGNOSIS — W2209XA Striking against other stationary object, initial encounter: Secondary | ICD-10-CM | POA: Insufficient documentation

## 2012-10-13 DIAGNOSIS — S62309S Unspecified fracture of unspecified metacarpal bone, sequela: Secondary | ICD-10-CM

## 2012-10-13 DIAGNOSIS — S62319A Displaced fracture of base of unspecified metacarpal bone, initial encounter for closed fracture: Principal | ICD-10-CM | POA: Insufficient documentation

## 2012-10-13 HISTORY — DX: Headache: R51

## 2012-10-13 HISTORY — PX: ORIF WRIST FRACTURE: SHX2133

## 2012-10-13 SURGERY — OPEN REDUCTION INTERNAL FIXATION (ORIF) WRIST FRACTURE
Anesthesia: Regional | Site: Hand | Laterality: Right | Wound class: Clean

## 2012-10-13 MED ORDER — BUPIVACAINE-EPINEPHRINE PF 0.5-1:200000 % IJ SOLN
INTRAMUSCULAR | Status: DC | PRN
  Administered 2012-10-13: 30 mL

## 2012-10-13 MED ORDER — LACTATED RINGERS IV SOLN
INTRAVENOUS | Status: DC | PRN
  Administered 2012-10-13 (×2): via INTRAVENOUS

## 2012-10-13 MED ORDER — ADULT MULTIVITAMIN W/MINERALS CH
1.0000 | ORAL_TABLET | Freq: Every day | ORAL | Status: DC
  Administered 2012-10-14: 1 via ORAL
  Filled 2012-10-13: qty 1

## 2012-10-13 MED ORDER — DIPHENHYDRAMINE HCL 25 MG PO CAPS: 25.0000 mg | ORAL_CAPSULE | Freq: Four times a day (QID) | ORAL | Status: DC | PRN

## 2012-10-13 MED ORDER — CEFAZOLIN SODIUM 1-5 GM-% IV SOLN
INTRAVENOUS | Status: AC
  Administered 2012-10-13: 1 g via INTRAVENOUS
  Filled 2012-10-13: qty 50

## 2012-10-13 MED ORDER — OXYCODONE HCL 5 MG PO TABS: 5.0000 mg | ORAL_TABLET | Freq: Once | ORAL | Status: DC | PRN

## 2012-10-13 MED ORDER — HYDROMORPHONE HCL PF 1 MG/ML IJ SOLN: 0.2500 mg | INTRAMUSCULAR | Status: DC | PRN

## 2012-10-13 MED ORDER — INFLUENZA VAC SPLIT QUAD 0.5 ML IM SUSP
0.5000 mL | INTRAMUSCULAR | Status: AC
  Administered 2012-10-14: 0.5 mL via INTRAMUSCULAR
  Filled 2012-10-13: qty 0.5

## 2012-10-13 MED ORDER — METHOCARBAMOL 100 MG/ML IJ SOLN
500.0000 mg | Freq: Four times a day (QID) | INTRAVENOUS | Status: DC | PRN
  Filled 2012-10-13: qty 5

## 2012-10-13 MED ORDER — DEXTROSE 5 % IV SOLN
3.0000 g | INTRAVENOUS | Status: AC
  Administered 2012-10-13: 3 g via INTRAVENOUS
  Filled 2012-10-13 (×3): qty 3000

## 2012-10-13 MED ORDER — ONDANSETRON HCL 4 MG PO TABS: 4.0000 mg | ORAL_TABLET | Freq: Four times a day (QID) | ORAL | Status: DC | PRN

## 2012-10-13 MED ORDER — VITAMIN C 500 MG PO TABS
1000.0000 mg | ORAL_TABLET | Freq: Every day | ORAL | Status: DC
  Administered 2012-10-14: 1000 mg via ORAL
  Filled 2012-10-13: qty 2

## 2012-10-13 MED ORDER — ONDANSETRON HCL 4 MG/2ML IJ SOLN: 4.0000 mg | Freq: Four times a day (QID) | INTRAMUSCULAR | Status: DC | PRN

## 2012-10-13 MED ORDER — 0.9 % SODIUM CHLORIDE (POUR BTL) OPTIME
TOPICAL | Status: DC | PRN
  Administered 2012-10-13: 1000 mL

## 2012-10-13 MED ORDER — BUPIVACAINE HCL (PF) 0.5 % IJ SOLN
INTRAMUSCULAR | Status: DC | PRN
  Administered 2012-10-13: 8 mL

## 2012-10-13 MED ORDER — SODIUM CHLORIDE 0.45 % IV SOLN: INTRAVENOUS | Status: DC

## 2012-10-13 MED ORDER — OXYCODONE HCL 5 MG PO TABS
5.0000 mg | ORAL_TABLET | ORAL | Status: DC | PRN
  Administered 2012-10-13 – 2012-10-14 (×5): 10 mg via ORAL
  Filled 2012-10-13 (×5): qty 2

## 2012-10-13 MED ORDER — DOCUSATE SODIUM 100 MG PO CAPS
100.0000 mg | ORAL_CAPSULE | Freq: Two times a day (BID) | ORAL | Status: DC
  Administered 2012-10-13 – 2012-10-14 (×2): 100 mg via ORAL
  Filled 2012-10-13 (×3): qty 1

## 2012-10-13 MED ORDER — PANTOPRAZOLE SODIUM 40 MG PO TBEC
40.0000 mg | DELAYED_RELEASE_TABLET | Freq: Two times a day (BID) | ORAL | Status: DC | PRN
  Filled 2012-10-13: qty 1

## 2012-10-13 MED ORDER — MIDAZOLAM HCL 2 MG/2ML IJ SOLN
2.0000 mg | Freq: Once | INTRAMUSCULAR | Status: AC
  Administered 2012-10-13: 2 mg via INTRAVENOUS

## 2012-10-13 MED ORDER — LIDOCAINE HCL (CARDIAC) 20 MG/ML IV SOLN
INTRAVENOUS | Status: DC | PRN
  Administered 2012-10-13: 50 mg via INTRAVENOUS

## 2012-10-13 MED ORDER — ONDANSETRON HCL 4 MG/2ML IJ SOLN
INTRAMUSCULAR | Status: DC | PRN
  Administered 2012-10-13: 4 mg via INTRAMUSCULAR

## 2012-10-13 MED ORDER — OXYCODONE HCL 5 MG/5ML PO SOLN: 5.0000 mg | Freq: Once | ORAL | Status: DC | PRN

## 2012-10-13 MED ORDER — MORPHINE SULFATE 2 MG/ML IJ SOLN
1.0000 mg | INTRAMUSCULAR | Status: DC | PRN
  Administered 2012-10-14: 1 mg via INTRAVENOUS
  Filled 2012-10-13: qty 1

## 2012-10-13 MED ORDER — LACTATED RINGERS IV SOLN: INTRAVENOUS | Status: DC

## 2012-10-13 MED ORDER — CEFAZOLIN SODIUM 1-5 GM-% IV SOLN
1.0000 g | INTRAVENOUS | Status: AC
  Administered 2012-10-13: 1 g via INTRAVENOUS

## 2012-10-13 MED ORDER — FENTANYL CITRATE 0.05 MG/ML IJ SOLN
INTRAMUSCULAR | Status: DC | PRN
  Administered 2012-10-13: 50 ug via INTRAVENOUS
  Administered 2012-10-13 (×2): 100 ug via INTRAVENOUS

## 2012-10-13 MED ORDER — CHLORHEXIDINE GLUCONATE 4 % EX LIQD: 60.0000 mL | Freq: Once | CUTANEOUS | Status: DC

## 2012-10-13 MED ORDER — FENTANYL CITRATE 0.05 MG/ML IJ SOLN
INTRAMUSCULAR | Status: AC
  Administered 2012-10-13: 100 ug via INTRAVENOUS
  Filled 2012-10-13: qty 2

## 2012-10-13 MED ORDER — MIDAZOLAM HCL 2 MG/2ML IJ SOLN
INTRAMUSCULAR | Status: AC
  Administered 2012-10-13: 2 mg via INTRAVENOUS
  Filled 2012-10-13: qty 2

## 2012-10-13 MED ORDER — CEFAZOLIN SODIUM 1-5 GM-% IV SOLN
1.0000 g | Freq: Three times a day (TID) | INTRAVENOUS | Status: DC
  Administered 2012-10-13 – 2012-10-14 (×3): 1 g via INTRAVENOUS
  Filled 2012-10-13 (×5): qty 50

## 2012-10-13 MED ORDER — ALPRAZOLAM 0.5 MG PO TABS
0.5000 mg | ORAL_TABLET | Freq: Four times a day (QID) | ORAL | Status: DC | PRN
  Administered 2012-10-13 – 2012-10-14 (×4): 0.5 mg via ORAL
  Filled 2012-10-13 (×4): qty 1

## 2012-10-13 MED ORDER — METHOCARBAMOL 500 MG PO TABS
500.0000 mg | ORAL_TABLET | Freq: Four times a day (QID) | ORAL | Status: DC | PRN
  Administered 2012-10-13 – 2012-10-14 (×4): 500 mg via ORAL
  Filled 2012-10-13 (×4): qty 1

## 2012-10-13 MED ORDER — LACTATED RINGERS IV SOLN
INTRAVENOUS | Status: DC
  Administered 2012-10-13 (×2): via INTRAVENOUS

## 2012-10-13 MED ORDER — PROPOFOL 10 MG/ML IV BOLUS
INTRAVENOUS | Status: DC | PRN
  Administered 2012-10-13: 200 mg via INTRAVENOUS

## 2012-10-13 MED ORDER — FENTANYL CITRATE 0.05 MG/ML IJ SOLN
100.0000 ug | Freq: Once | INTRAMUSCULAR | Status: AC
  Administered 2012-10-13: 100 ug via INTRAVENOUS

## 2012-10-13 SURGICAL SUPPLY — 53 items
BANDAGE ELASTIC 3 VELCRO ST LF (GAUZE/BANDAGES/DRESSINGS) IMPLANT
BANDAGE ELASTIC 4 VELCRO ST LF (GAUZE/BANDAGES/DRESSINGS) ×2 IMPLANT
BANDAGE GAUZE 4  KLING STR (GAUZE/BANDAGES/DRESSINGS) ×1 IMPLANT
BANDAGE GAUZE ELAST BULKY 4 IN (GAUZE/BANDAGES/DRESSINGS) IMPLANT
BLADE SURG ROTATE 9660 (MISCELLANEOUS) IMPLANT
BNDG CMPR 9X4 STRL LF SNTH (GAUZE/BANDAGES/DRESSINGS) ×1
BNDG ESMARK 4X9 LF (GAUZE/BANDAGES/DRESSINGS) ×2 IMPLANT
CLOTH BEACON ORANGE TIMEOUT ST (SAFETY) ×1 IMPLANT
CORDS BIPOLAR (ELECTRODE) ×2 IMPLANT
COVER SURGICAL LIGHT HANDLE (MISCELLANEOUS) ×2 IMPLANT
CUFF TOURNIQUET SINGLE 18IN (TOURNIQUET CUFF) ×2 IMPLANT
CUFF TOURNIQUET SINGLE 24IN (TOURNIQUET CUFF) IMPLANT
DRAIN TLS ROUND 10FR (DRAIN) IMPLANT
DRAPE OEC MINIVIEW 54X84 (DRAPES) ×1 IMPLANT
DRAPE SURG 17X23 STRL (DRAPES) ×2 IMPLANT
DRSG ADAPTIC 3X8 NADH LF (GAUZE/BANDAGES/DRESSINGS) ×2 IMPLANT
GAUZE XEROFORM 1X8 LF (GAUZE/BANDAGES/DRESSINGS) IMPLANT
GAUZE XEROFORM 5X9 LF (GAUZE/BANDAGES/DRESSINGS) ×2 IMPLANT
GLOVE BIOGEL M STRL SZ7.5 (GLOVE) ×2 IMPLANT
GLOVE SS BIOGEL STRL SZ 8 (GLOVE) ×1 IMPLANT
GLOVE SUPERSENSE BIOGEL SZ 8 (GLOVE) ×1
GOWN PREVENTION PLUS XLARGE (GOWN DISPOSABLE) ×2 IMPLANT
GOWN STRL NON-REIN LRG LVL3 (GOWN DISPOSABLE) ×6 IMPLANT
GOWN STRL REIN XL XLG (GOWN DISPOSABLE) ×4 IMPLANT
K-WIRE SMTH SNGL TROCAR .045X9 (WIRE) ×8
KIT BASIN OR (CUSTOM PROCEDURE TRAY) ×2 IMPLANT
KIT ROOM TURNOVER OR (KITS) ×2 IMPLANT
KWIRE SMTH SNGL TROCAR .045X9 (WIRE) IMPLANT
LOOP VESSEL MAXI BLUE (MISCELLANEOUS) IMPLANT
MANIFOLD NEPTUNE II (INSTRUMENTS) IMPLANT
NEEDLE 22X1 1/2 (OR ONLY) (NEEDLE) IMPLANT
NS IRRIG 1000ML POUR BTL (IV SOLUTION) ×2 IMPLANT
PACK ORTHO EXTREMITY (CUSTOM PROCEDURE TRAY) ×2 IMPLANT
PAD ARMBOARD 7.5X6 YLW CONV (MISCELLANEOUS) ×4 IMPLANT
PAD CAST 3X4 CTTN HI CHSV (CAST SUPPLIES) ×1 IMPLANT
PAD CAST 4YDX4 CTTN HI CHSV (CAST SUPPLIES) ×1 IMPLANT
PADDING CAST COTTON 3X4 STRL (CAST SUPPLIES) ×2
PADDING CAST COTTON 4X4 STRL (CAST SUPPLIES) ×2
PUTTY DBM STAGRAFT PLUS 2CC (Putty) ×2 IMPLANT
SPLINT FIBERGLASS 4X30 (CAST SUPPLIES) ×2 IMPLANT
SPONGE GAUZE 4X4 12PLY (GAUZE/BANDAGES/DRESSINGS) ×2 IMPLANT
SPONGE LAP 4X18 X RAY DECT (DISPOSABLE) IMPLANT
SUT MNCRL AB 4-0 PS2 18 (SUTURE) ×2 IMPLANT
SUT PROLENE 3 0 PS 2 (SUTURE) ×1 IMPLANT
SUT PROLENE 4 0 P 3 18 (SUTURE) ×1 IMPLANT
SUT VIC AB 3-0 FS2 27 (SUTURE) IMPLANT
SYR CONTROL 10ML LL (SYRINGE) IMPLANT
SYSTEM CHEST DRAIN TLS 7FR (DRAIN) IMPLANT
TOWEL OR 17X24 6PK STRL BLUE (TOWEL DISPOSABLE) ×2 IMPLANT
TOWEL OR 17X26 10 PK STRL BLUE (TOWEL DISPOSABLE) ×2 IMPLANT
TUBE CONNECTING 12X1/4 (SUCTIONS) ×2 IMPLANT
TUBE EVACUATION TLS (MISCELLANEOUS) ×2 IMPLANT
WATER STERILE IRR 1000ML POUR (IV SOLUTION) ×2 IMPLANT

## 2012-10-13 NOTE — Transfer of Care (Signed)
Immediate Anesthesia Transfer of Care Note  Patient: Daniel Hays  Procedure(s) Performed: Procedure(s): OPEN REDUCTION INTERNAL FIXATION (ORIF)RIGHT 5TH CARPAL METACARPAL FRACTURE HAND   (Right)  Patient Location: PACU  Anesthesia Type:General  Level of Consciousness: awake, alert  and oriented  Airway & Oxygen Therapy: Patient Spontanous Breathing and Patient connected to nasal cannula oxygen  Post-op Assessment: Report given to PACU RN, Post -op Vital signs reviewed and stable and Patient moving all extremities X 4  Post vital signs: Reviewed and stable  Complications: No apparent anesthesia complications

## 2012-10-13 NOTE — Anesthesia Procedure Notes (Addendum)
Anesthesia Regional Block:  Supraclavicular block  Pre-Anesthetic Checklist: ,, timeout performed, Correct Patient, Correct Site, Correct Laterality, Correct Procedure, Correct Position, site marked, Risks and benefits discussed, pre-op evaluation, post-op pain management  Laterality: Right  Prep: Maximum Sterile Barrier Precautions used and chloraprep       Needles:  Injection technique: Single-shot  Needle Type: Echogenic Stimulator Needle     Needle Length: 5cm 5 cm Needle Gauge: 22 and 22 G    Additional Needles:  Procedures: ultrasound guided (picture in chart) Supraclavicular block Narrative:  Start time: 10/13/2012 1:00 PM End time: 10/13/2012 1:08 PM Injection made incrementally with aspirations every 5 mL. Anesthesiologist: Fitzgerald,MD  Additional Notes: 2% Lidocaine skin wheel. Intercostobrachial block with 8cc of 0.5% Bupivicaine plain.  Supraclavicular block Procedure Name: LMA Insertion Date/Time: 10/13/2012 1:51 PM Performed by: Coralee Rud Pre-anesthesia Checklist: Patient identified, Emergency Drugs available, Suction available and Patient being monitored Patient Re-evaluated:Patient Re-evaluated prior to inductionOxygen Delivery Method: Circle system utilized Preoxygenation: Pre-oxygenation with 100% oxygen Intubation Type: IV induction Ventilation: Mask ventilation without difficulty LMA: LMA inserted LMA Size: 5.0 Number of attempts: 1 Tube secured with: Tape Dental Injury: Teeth and Oropharynx as per pre-operative assessment

## 2012-10-13 NOTE — H&P (Signed)
Daniel Hays is an 32 y.o. male.   Chief Complaint: Fractured my finger HPI: the patient is a 32 year old male who present for intervention in regards to his right hand in particular his fifth CMC joint. The patient was seen last month on September 30 in the emergency room setting. He struck a wall by me with his right hand resulting in a comminuted CMC fracture of the fifth finger. At that juncture we had evaluated him as there is a concern per the emergency room staff that he could possibly be developing a compartment syndrome. He did not have signs of a compartment syndrome we placed him in a splint. We have followed him in our office and I discussed with him after reviewing his CT scan of the hand ifthe fracture had movement or displacement surgical intervention would be required. Unfortunately in followup he did have furthering displacement about the fifth [facility] joint this we have recommended definitive fixation. All questions were encouraged and answered.  Past Medical History  Diagnosis Date  . ZOXWRUEA(540.9)     Past Surgical History  Procedure Laterality Date  . Appendectomy    . Colonoscopy      Hx: of    Family History  Problem Relation Age of Onset  . Cancer - Other Other    Social History:  reports that he has been smoking Cigarettes.  He has been smoking about 1.00 pack per day. He has never used smokeless tobacco. He reports that he drinks alcohol. He reports that he does not use illicit drugs.  Allergies: No Known Allergies  No prescriptions prior to admission    No results found for this or any previous visit (from the past 48 hour(s)). No results found.  Review of Systems  Constitutional: Negative.   HENT: Negative.   Eyes: Negative.   Respiratory: Negative.   Gastrointestinal: Negative.   Musculoskeletal:       See history of present illness  Skin: Negative.   Neurological: Negative.     Height 6' (1.829 m), weight 92.987 kg (205 lb). Physical Exam  The  patient is pleasant in no acute distress. Alert and oriented x3  HEENT: Atraumatic normocephalic  Chest: Respirations are equal and nonlabored.  Abdomen: Nontender  Right upper extremity: splint is clean and intact sensation intact ,no signs of compartment syndrome are present he Assessment/Plan Right fifth CMC fracture with progressive displacement .Marland KitchenWe are planning surgery for your upper extremity. The risk and benefits of surgery include risk of bleeding infection anesthesia damage to normal structures and failure of the surgery to accomplish its intended goals of relieving symptoms and restoring function with this in mind we'll going to proceed. I have specifically discussed with the patient the pre-and postoperative regime and the does and don'ts and risk and benefits in great detail. Risk and benefits of surgery also include risk of dystrophy chronic nerve pain failure of the healing process to go onto completion and other inherent risks of surgery The relavent the pathophysiology of the disease/injury process, as well as the alternatives for treatment and postoperative course of action has been discussed in great detail with the patient who desires to proceed.  We will do everything in our power to help you (the patient) restore function to the upper extremity. Is a pleasure to see this patient today.   Keilynn Marano L 10/13/2012, 12:22 PM

## 2012-10-13 NOTE — Anesthesia Preprocedure Evaluation (Addendum)
Anesthesia Evaluation  Patient identified by MRN, date of birth, ID band Patient awake    Reviewed: Allergy & Precautions, H&P , NPO status , Patient's Chart, lab work & pertinent test results  Airway Mallampati: II TM Distance: >3 FB Neck ROM: Full    Dental no notable dental hx. (+) Teeth Intact and Dental Advisory Given   Pulmonary Current Smoker,  breath sounds clear to auscultation  Pulmonary exam normal       Cardiovascular negative cardio ROS  Rhythm:Regular Rate:Normal     Neuro/Psych  Headaches, negative psych ROS   GI/Hepatic negative GI ROS, Neg liver ROS,   Endo/Other  negative endocrine ROS  Renal/GU negative Renal ROS  negative genitourinary   Musculoskeletal   Abdominal   Peds  Hematology negative hematology ROS (+)   Anesthesia Other Findings   Reproductive/Obstetrics negative OB ROS                           Anesthesia Physical Anesthesia Plan  ASA: II  Anesthesia Plan: General and Regional   Post-op Pain Management:    Induction: Intravenous  Airway Management Planned: LMA  Additional Equipment:   Intra-op Plan:   Post-operative Plan: Extubation in OR  Informed Consent: I have reviewed the patients History and Physical, chart, labs and discussed the procedure including the risks, benefits and alternatives for the proposed anesthesia with the patient or authorized representative who has indicated his/her understanding and acceptance.   Dental advisory given  Plan Discussed with: CRNA  Anesthesia Plan Comments:         Anesthesia Quick Evaluation  

## 2012-10-13 NOTE — Preoperative (Signed)
Beta Blockers   Reason not to administer Beta Blockers:Not Applicable 

## 2012-10-13 NOTE — Op Note (Signed)
See Dictation #[id number] Dominica Severin MD

## 2012-10-13 NOTE — Anesthesia Postprocedure Evaluation (Signed)
  Anesthesia Post-op Note  Patient: Daniel Hays  Procedure(s) Performed: Procedure(s): OPEN REDUCTION INTERNAL FIXATION (ORIF)RIGHT 5TH CARPAL METACARPAL FRACTURE HAND   (Right)  Patient Location: PACU  Anesthesia Type:General  Level of Consciousness: awake, oriented, patient cooperative and responds to stimulation  Airway and Oxygen Therapy: Patient Spontanous Breathing  Post-op Pain: mild  Post-op Assessment: Respiratory Function Stable, Patent Airway, No signs of Nausea or vomiting and Pain level controlled  Post-op Vital Signs: Reviewed and stable  Complications: No apparent anesthesia complications

## 2012-10-14 MED ORDER — MORPHINE SULFATE 2 MG/ML IJ SOLN
1.0000 mg | INTRAMUSCULAR | Status: DC | PRN
  Administered 2012-10-14 (×2): 2 mg via INTRAVENOUS
  Administered 2012-10-14: 4 mg via INTRAVENOUS
  Administered 2012-10-14: 1 mg via INTRAVENOUS
  Filled 2012-10-14: qty 1
  Filled 2012-10-14: qty 2
  Filled 2012-10-14 (×2): qty 1

## 2012-10-14 MED ORDER — MORPHINE SULFATE 4 MG/ML IJ SOLN
INTRAMUSCULAR | Status: AC
  Administered 2012-10-14: 4 mg
  Filled 2012-10-14: qty 1

## 2012-10-14 MED ORDER — OXYCODONE HCL 5 MG PO TABS: 5.0000 mg | ORAL_TABLET | ORAL | Status: DC | PRN

## 2012-10-14 NOTE — Discharge Summary (Signed)
Physician Discharge Summary  Patient ID: Daniel Hays MRN: [id number] DOB/AGE: 01/13/1980 32 y.o.  Admit date: 10/13/2012 Discharge date: 10/14/2012  Admission Diagnoses: Comminuted displaced right fifth carpometacarpal fracture Discharge Diagnoses: Same, improved   Discharged Condition: Improved  Hospital Course: The patient is a pleasant 32 year old gentleman who is seen and treated in regards to his right hand. He sustained a comminuted metacarpal fracture last week and we discussed with the patient conservative versus a surgical route. At that juncture with significant to conservative measures. Have discussed with him our concerns for progressive displacement and angulation. Unfortunately patient went on to have progressive angulation collapse and further displacement does necessitating the need for surgical intervention discussed all issues with the patient at length. He presented for an ORIF of the right fifth CMC fracture. Daniel Hays underwent open reduction internal fixation and the operative suite without complications, please see operative report for full details. The patient was admitted to the orthopedic unit for pain control, IV antibiotics and close observation. He did very well but did have some difficulties with pain control. The patient does have a history of opioid dependency in the past and has been involved in rehabilitation in the past. We discussed with him our concerns as we did not want to this  spiral him into a dependency issue. Started day #1 he was awake alert and oriented he was in no acute distress. He was complaining of pain as expected but had no signs of infection or dystrophy his sensation was intact to the digits and range of motion was intact. Discussed with the patient discharge recommendations and follow up in the office was tolerating a regular diet and voiding without difficulties. Decision made to discharge him home.  Consults: None   Treatments: See  operative report  Discharge Exam: Blood pressure 141/88, pulse 56, temperature 98.9 F (37.2 C), temperature source Oral, resp. rate 18, height 6\' 1"  (1.854 m), weight 87.5 kg (192 lb 14.4 oz), SpO2 100.00%. Marland Kitchen.The patient is alert and oriented in no acute distress the patient complains of pain in the affected upper extremity.  The patient is noted to have a normal HEENT exam.  Lung fields show equal chest expansion and no shortness of breath  abdomen exam is nontender without distention.  Lower extremity examination does not show any fracture dislocation or blood clot symptoms.  Pelvis is stable neck and back are stable and nontender  right upper extremity showed that his splint was clean and intact he had excellent refill to the digits sensation was intact range of motion was tender as expected no signs of infection are present.  Disposition: 01-Home or Self Care  Discharge Orders   Future Orders Complete By Expires   Call MD / Call 911  As directed    Comments:     If you experience chest pain or shortness of breath, CALL 911 and be transported to the hospital emergency room.  If you develope a fever above 101 F, pus (white drainage) or increased drainage or redness at the wound, or calf pain, call your surgeon's office.   Constipation Prevention  As directed    Comments:     Drink plenty of fluids.  Prune juice may be helpful.  You may use a stool softener, such as Colace (over the counter) 100 mg twice a day.  Use MiraLax (over the counter) for constipation as needed.   Diet - low sodium heart healthy  As directed    Discharge instructions  As directed  Comments:     Marland KitchenMarland KitchenKeep bandage clean and dry.  Call for any problems.  No smoking.  Criteria for driving a car: you should be off your pain medicine for 7-8 hours, able to drive one handed(confident), thinking clearly and feeling able in your judgement to drive. Continue elevation as it will decrease swelling.  If instructed by MD move  your fingers within the confines of the bandage/splint.  Use ice if instructed by your MD. Call immediately for any sudden loss of feeling in your hand/arm or change in functional abilities of the extremity. Marland Kitchen.We recommend that you to take vitamin C 1000 mg a day to promote healing we also recommend that if you require her pain medicine that he take a stool softener to prevent constipation as most pain medicines will have constipation side effects. We recommend either Peri-Colace or Senokot and recommend that you also consider adding MiraLAX to prevent the constipation affects from pain medicine if you are required to use them. These medicines are over the counter and maybe purchased at a local pharmacy.   Increase activity slowly as tolerated  As directed        Medication List         oxyCODONE 5 MG immediate release tablet  Commonly known as:  Oxy IR/ROXICODONE  Take 1 tablet (5 mg total) by mouth every 4 (four) hours as needed (Take 1 to 2 every 4 hours for pain as needed).           Follow-up Information   Follow up with Karen Chafe, MD. Schedule an appointment as soon as possible for a visit in 10 days.   Specialty:  Orthopedic Surgery   Contact information:   [address] [place] [address] [phone]       Signed: Sheran Lawless 10/14/2012, 11:01 AM

## 2012-10-14 NOTE — Progress Notes (Signed)
PT Cancellation/Discharge Note  Patient Details Name: Daniel Hays MRN: [id number] DOB: 10-03-1980   Cancelled Treatment:    Reason Eval/Treat Not Completed: PT screened, no needs identified, will sign off   Vena Austria 10/14/2012, 12:20 PM Durenda Hurt. Renaldo Fiddler, [facility] Acute Rehab Services Pager [phone]

## 2012-10-14 NOTE — Op Note (Signed)
NAMEFABYAN, LOUGHMILLER                 ACCOUNT NO.:  [id number]  MEDICAL RECORD NO.:  [id number]  LOCATION:  5N31C                        FACILITY:  [facility]  PHYSICIAN:  Dionne Ano. Noriah Osgood, M.D.DATE OF BIRTH:  March 25, 1980  DATE OF PROCEDURE:  10/13/2012 DATE OF DISCHARGE:                              OPERATIVE REPORT   PREOPERATIVE DIAGNOSIS:  Carpometacarpal fracture, intra-articular with displacement (carpometacarpal fracture) fifth right hand.  POSTOPERATIVE DIAGNOSIS:  Carpometacarpal fracture, intra-articular with displacement (carpometacarpal fracture) fifth right hand  PROCEDURE: 1. Open reduction and internal fixation intra-articular fit,     carpometacarpal fracture with Kirschner wire fixation and     allograft, bone graft. 2. Stress radiography.  SURGEON:  Dionne Ano. Amanda Pea, M.D.  ASSISTANT:  Karie Chimera, PA-C  TOURNIQUET TIME:  Less than an hour.  INDICATIONS:  This is a 32 year old male, who is gone on to progressive angulation of a fifth CMC fracture.  This is a very violent injury with a lot of soft tissue swelling.  His swelling is decreased to stable parameters.  He remains intact to vascular and sensory exam.  He still has quite a bit of pain, although he has been in pain management, and is somewhat of a pain focused gentleman.  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  We would recommend open reduction and internal fixation to try to recreate his anatomy and give him the best opportunity for full function.  I have discussed with him relevant issues, do's and don'ts.  OPERATIVE PROCEDURE:  The patient was seen by myself and anesthesia, taken to the operative suite, underwent smooth induction of general anesthetic.  Prepped and draped in a usual sterile fashion with Betadine scrub and paint.  Preoperative block was in stable  fashion.  Following this, the patient underwent a very careful and cautious approach to the extremity with prep and drape.  Following this, the patient had sterile field secured.  Time-out was called.  Arm was elevated, tourniquet was insufflated to 250 mmHg.  __________ incision was made dorsal and ulnarly.  Skin flap was elevated nicely.  Superficial sensory branch to the ulnar nerve was not extensively dissected.  We kept this in mind, and very carefully retracted all areas out of harm's way.  Following this, I made an interval between the ECU and the EDM tendons.  I then incised the capsule overlying the fifth CMC joint.  Once this done, we then very carefully disengaged a large cortical fragment of bone that was stuffed up into the canal.  We then reassembled the joint with a joysticks.  Following this, there was a large void as expected and we packed this with allograft and bone graft from [employer].  Once this was done, we then placed a picket fence fixation system of 0.045 K-wires. The patient tolerated this well.  There were no complicating features. Following this, the patient then underwent a very careful and cautious irrigation and closure of the capsule.  Following this, final copy x- rays were reviewed and looked excellent.  I was pleased with this and the findings.  There were no issues.  Once this was complete, the patient then underwent a very careful and cautious closure of the wound with Prolene, followed by a volar fiberglass splint.  He was taken to recovery room, and assessed pain management overnight stay and to see [place] back in the office in 12 days.  Sutures will be removed.  Cast applied. We will remove pins in 6-8 weeks __________ upon his care plan and postop radiographic progression of healing.  These notes have been discussed.  We were able to restore the joint line nicely.  AP and __________ views looked quite nice and we were quite pleased with this at the  conclusion of the case.     Dionne Ano. Amanda Pea, M.D.     [facility]  D:  10/13/2012  T:  10/14/2012  Job:  [id number]

## 2012-10-14 NOTE — Progress Notes (Deleted)
MEDICARE-CERTIFIED HOME HEALTH AGENCIES [place]  Agencies that are Medicare-Certified and are affiliated with [facility]  Home Health Agency  Telephone Number Address  [facility]  [facility] has ownership interest in this company; however, you are under no obligation to use this agency. [phone] or [phone] [address] [place], [place]  [address]    Agencies that are Medicare-Certified and are not affiliated with [facility]   Home Health Agency  Telephone Number Address  [facility] [phone] Fax [phone] [address] [place], [place]  [address]  [facility] [phone] [address] [place], [place] [address]  [facility] [phone] [address] [place], [place]  [address]  [facility]  [phone] [address] [place], [place] [address]  [facility]  [phone] Fax [phone] [address] [place], [place]  [address]  [facility] [phone] or [phone] [address] [place], [place] [address]  [facility] [phone] [address] [place], [place] [address]  [facility] [phone]  [address] [place], [place] [address]  [facility] [phone] or [phone]  [address], [address] , [place]  [address]  [facility] [phone] Fax [phone] [address] [place] [address] [place], [place]  [address]  [facility]  [phone]  Fax [phone] [address], [address] [place], [place]  [address]    

## 2012-10-14 NOTE — Progress Notes (Signed)
Hardcopy prescriptions given to mother of patient so they can be filled today before six pm.  MD aware.

## 2012-10-14 NOTE — Progress Notes (Signed)
Utilization Review Completed.Daniel Hays T10/12/2012  

## 2012-10-14 NOTE — Evaluation (Signed)
Occupational Therapy Evaluation Patient Details Name: Daniel Hays MRN: [id number] DOB: 19-Nov-1980 Today's Date: 10/14/2012 Time: 4098-1191 OT Time Calculation (min): 18 min  OT Assessment / Plan / Recommendation History of present illness 32 y.o. s/p OPEN REDUCTION INTERNAL FIXATION (ORIF)RIGHT 5TH CARPAL METACARPAL FRACTURE HAND   (Right)   Clinical Impression   Pt moving well during evaluation. Provided education and stressed importance of icing, elevating, and moving digits. Feel pt is safe to d/c home. No further OT needs.     OT Assessment  Patient does not need any further OT services    Follow Up Recommendations  No OT follow up    Barriers to Discharge      Equipment Recommendations  None recommended by OT    Recommendations for Other Services    Frequency       Precautions / Restrictions Precautions Precaution Comments: elevation, edema control, ADLs. No pushing, pulling, lifting with RUE. Restrictions Weight Bearing Restrictions: Yes RUE Weight Bearing: Non weight bearing Other Position/Activity Restrictions: weight bear through elbow only   Pertinent Vitals/Pain Pain 9/10. Applied ice and elevated on pillows.     ADL  Eating/Feeding: Set up Where Assessed - Eating/Feeding: Edge of bed Grooming: Set up;Supervision/safety Where Assessed - Grooming: Unsupported sitting Upper Body Bathing: Set up;Supervision/safety Where Assessed - Upper Body Bathing: Unsupported sitting Lower Body Bathing: Set up;Supervision/safety Where Assessed - Lower Body Bathing: Unsupported sit to stand Upper Body Dressing: Minimal assistance Where Assessed - Upper Body Dressing: Unsupported sitting Lower Body Dressing: Minimal assistance Where Assessed - Lower Body Dressing: Unsupported sit to stand Toilet Transfer: Modified independent Toilet Transfer Method: Sit to stand Toilet Transfer Equipment: Regular height toilet Tub/Shower Transfer Method: Not  assessed Transfers/Ambulation Related to ADLs: Mod I ADL Comments: OT stressed importance of elevating, icing, and moving digits on Rt hand. Educated on not bearing weight through LUE (elbow only).  Discussed using left hand to pull up LB clothing and dressing technique and how he can use Rt hand to assist with light tasks only (toothbrush etc.) but no lifting pulling pushing. Spoke about elastic waist pants may be easier to avoid buttons.    OT Diagnosis:    OT Problem List:   OT Treatment Interventions:     OT Goals(Current goals can be found in the care plan section)    Visit Information  Last OT Received On: 10/14/12 Assistance Needed: +1 History of Present Illness: 32 y.o. s/p OPEN REDUCTION INTERNAL FIXATION (ORIF)RIGHT 5TH CARPAL METACARPAL FRACTURE HAND   (Right)       Prior Functioning     Home Living Family/patient expects to be discharged to:: Private residence Living Arrangements: Spouse/significant other Available Help at Discharge: Available 24 hours/day;Friend(s) Type of Home: House Home Access: Stairs to enter Secretary/administrator of Steps: 2 Entrance Stairs-Rails: Can reach both Home Layout: One level Home Equipment: None Prior Function Level of Independence: Independent Communication Communication: No difficulties Dominant Hand: Right         Vision/Perception     Cognition  Cognition Arousal/Alertness: Awake/alert Behavior During Therapy: WFL for tasks assessed/performed Overall Cognitive Status: Within Functional Limits for tasks assessed    Extremity/Trunk Assessment Upper Extremity Assessment Upper Extremity Assessment: RUE deficits/detail RUE Deficits / Details: limited ROM in fingers RUE: Unable to fully assess due to immobilization     Mobility Bed Mobility Bed Mobility: Supine to Sit;Sit to Supine Supine to Sit: 6: Modified independent (Device/Increase time) Sit to Supine: 6: Modified independent (Device/Increase  time) Transfers  Transfers: Sit to Stand;Stand to Sit Sit to Stand: 6: Modified independent (Device/Increase time);From bed;From toilet Stand to Sit: 6: Modified independent (Device/Increase time);To bed;To toilet     Exercise     Balance     End of Session OT - End of Session Activity Tolerance: Patient tolerated treatment well Patient left: in bed;with call bell/phone within reach Nurse Communication: Mobility status  GO Functional Assessment Tool Used: clinical judgment Functional Limitation: Self care Self Care Current Status (G9562): At least 1 percent but less than 20 percent impaired, limited or restricted Self Care Goal Status (Z3086): At least 1 percent but less than 20 percent impaired, limited or restricted Self Care Discharge Status [phone]): At least 1 percent but less than 20 percent impaired, limited or restricted   Earlie Raveling OTR/L [phone] 10/14/2012, 12:10 PM

## 2012-10-14 NOTE — Progress Notes (Signed)
I applied x3 ice packs to LUE, when I re-entered room, patient had it off x2.  Attempted to educate patient about the benefits of cold therapy, pt still refused.  Nsg to continue to monitor for status changes.

## 2012-10-17 ENCOUNTER — Encounter (HOSPITAL_COMMUNITY): Payer: Self-pay | Admitting: Orthopedic Surgery

## 2012-11-11 ENCOUNTER — Emergency Department (HOSPITAL_COMMUNITY): Payer: 59

## 2012-11-11 ENCOUNTER — Emergency Department (HOSPITAL_COMMUNITY)
Admission: EM | Admit: 2012-11-11 | Discharge: 2012-11-11 | Disposition: A | Discharge status: Home / Self Care | Payer: 59 | Attending: Emergency Medicine | Admitting: Emergency Medicine

## 2012-11-11 ENCOUNTER — Encounter (HOSPITAL_COMMUNITY): Payer: Self-pay | Admitting: Emergency Medicine

## 2012-11-11 DIAGNOSIS — M25549 Pain in joints of unspecified hand: Secondary | ICD-10-CM | POA: Insufficient documentation

## 2012-11-11 DIAGNOSIS — R209 Unspecified disturbances of skin sensation: Secondary | ICD-10-CM | POA: Insufficient documentation

## 2012-11-11 DIAGNOSIS — M79641 Pain in right hand: Secondary | ICD-10-CM

## 2012-11-11 DIAGNOSIS — G8918 Other acute postprocedural pain: Secondary | ICD-10-CM | POA: Insufficient documentation

## 2012-11-11 DIAGNOSIS — F17200 Nicotine dependence, unspecified, uncomplicated: Secondary | ICD-10-CM | POA: Insufficient documentation

## 2012-11-11 MED ORDER — OXYCODONE-ACETAMINOPHEN 5-325 MG PO TABS
2.0000 | ORAL_TABLET | Freq: Once | ORAL | Status: AC
  Administered 2012-11-11: 2 via ORAL
  Filled 2012-11-11: qty 2

## 2012-11-11 NOTE — ED Notes (Signed)
Went into room to discharge pt, pt asking about pain medication, family member at bedside advised pt to follow up with hand surgeon who was on call today. I asked pt if he had any pain medication at home, pt states "No I took my last dose last night", explained to pt what he had reported to me in triage, pt states "no I don't have any left" reports that his last prescription was 4 weeks ago, family member at bedside states "it has been two weeks", asked pt if he had talked to [place] PA about pain medication, pt states "no", asked pt if he wanted to speak with Link Snuffer PA pt refused, family member states " we are going to follow up with hand surgeon on call"

## 2012-11-11 NOTE — ED Notes (Addendum)
Pt states that he had surgery on right hand by Dr. Amanda Pea recently, last visit to office was two weeks ago, states that the pain became worse yesterday, has not contacted Dr. Amanda Pea to notify him of change in pain, pt able to wiggle fingers but pain is worse with movement, has full cast in place to right hand, pt states that he takes oxycodone 10mg  for his pain, last dose of any pain medication was last night, states "I just came to er this am did not think about taking anything for pain"

## 2012-11-11 NOTE — ED Provider Notes (Signed)
Medical screening examination/treatment/procedure(s) were performed by non-physician practitioner and as supervising physician I was immediately available for consultation/collaboration.  Flint Melter, MD 11/11/12 1524

## 2012-11-11 NOTE — ED Provider Notes (Signed)
CSN: [id number]     Arrival date & time 11/11/12  1040 History   First MD Initiated Contact with Patient 11/11/12 1116     Chief Complaint  Patient presents with  . Hand Pain   (Consider location/radiation/quality/duration/timing/severity/associated sxs/prior Treatment) HPI Comments: Patient is a 32 year old meal use sustained a fracture of his right hand on September 28. He had surgery on the hand on October 11 with pins to fix the fractures. The patient states that starting yesterday he began to have increasing pain. The patient states that he now has a sensation of the stinging,, pins and needles, and pain that is shooting in his hands that seems to be getting progressively worse. The patient is concerned for possible breaking of any of the hardware that was placed in his hand.  Patient is a 32 y.o. male presenting with hand pain. The history is provided by the patient.  Hand Pain Pertinent negatives include no abdominal pain, arthralgias, chest pain, coughing or neck pain.    Past Medical History  Diagnosis Date  . [id number](478.2)    Past Surgical History  Procedure Laterality Date  . Appendectomy    . Colonoscopy      Hx: of  . Orif wrist fracture Right 10/13/2012    Procedure: OPEN REDUCTION INTERNAL FIXATION (ORIF)RIGHT 5TH CARPAL METACARPAL FRACTURE HAND  ;  Surgeon: Dominica Severin, MD;  Location: [facility] OR;  Service: Orthopedics;  Laterality: Right;   Family History  Problem Relation Age of Onset  . Cancer - Other Other    History  Substance Use Topics  . Smoking status: Current Every Day Smoker -- 1.00 packs/day    Types: Cigarettes  . Smokeless tobacco: Never Used  . Alcohol Use: Yes     Comment: occ use    Review of Systems  Constitutional: Negative for activity change.       All ROS Neg except as noted in HPI  HENT: Negative for nosebleeds.   Eyes: Negative for photophobia and discharge.  Respiratory: Negative for cough, shortness of breath and wheezing.    Cardiovascular: Negative for chest pain and palpitations.  Gastrointestinal: Negative for abdominal pain and blood in stool.  Genitourinary: Negative for dysuria, frequency and hematuria.  Musculoskeletal: Negative for arthralgias, back pain and neck pain.  Skin: Negative.   Neurological: Negative for dizziness, seizures and speech difficulty.  Psychiatric/Behavioral: Negative for hallucinations and confusion.    Allergies  Review of patient's allergies indicates no known allergies.  Home Medications   Current Outpatient Rx  Name  Route  Sig  Dispense  Refill  . oxyCODONE (OXY IR/ROXICODONE) 5 MG immediate release tablet   Oral   Take 1 tablet (5 mg total) by mouth every 4 (four) hours as needed (Take 1 to 2 every 4 hours for pain as needed).   40 tablet   0    BP 143/85  Pulse 85  Temp(Src) 98 F (36.7 C) (Oral)  Resp 20  SpO2 100% Physical Exam  Nursing note and vitals reviewed. Constitutional: He is oriented to person, place, and time. He appears well-developed and well-nourished.  Non-toxic appearance.  HENT:  Head: Normocephalic.  Right Ear: Tympanic membrane and external ear normal.  Left Ear: Tympanic membrane and external ear normal.  Eyes: EOM and lids are normal. Pupils are equal, round, and reactive to light.  Neck: Normal range of motion. Neck supple. Carotid bruit is not present.  Cardiovascular: Normal rate, regular rhythm, normal heart sounds, intact distal pulses and normal  pulses.   Pulmonary/Chest: Breath sounds normal. No respiratory distress.  Abdominal: Soft. Bowel sounds are normal. There is no tenderness. There is no guarding.  Musculoskeletal: Normal range of motion.  There is good range of motion of the right shoulder. There is no palpable dose of the biceps triceps area. There is a cast from the MP joints to the mid forearm. There no temperature changes noted of the fingers. There is good capillary refill noted of the fingers. Is good movement of  the fingers of the right hand.  Lymphadenopathy:       Head (right side): No submandibular adenopathy present.       Head (left side): No submandibular adenopathy present.    He has no cervical adenopathy.  Neurological: He is alert and oriented to person, place, and time. He has normal strength. No cranial nerve deficit or sensory deficit.  Skin: Skin is warm and dry.  Psychiatric: He has a normal mood and affect. His speech is normal.    ED Course  Procedures (including critical care time) Labs Review Labs Reviewed - No data to display Imaging Review Dg Hand Complete Right  11/11/2012   CLINICAL DATA:  Hand pain post surgery.  EXAM: RIGHT HAND - COMPLETE 3+ VIEW  COMPARISON:  10/02/2012  FINDINGS: There is an overlying cast with present which obscures evaluation of bony detail. There are 4 fixation wires extending across the base of the metacarpals 3 foreign 5 with fixation of patient's known base of 5th metacarpal fracture. Remainder of the exam is unchanged.  IMPRESSION: Internal fixation of patient's 5th metacarpal fracture with hardware intact. No acute findings.   Electronically Signed   By: Elberta Fortis M.D.   On: 11/11/2012 12:21    EKG Interpretation   None       MDM  No diagnosis found. **I have reviewed nursing notes, vital signs, and all appropriate lab and imaging results for this patient.*  X-ray of the right hand obtained. The x-ray of the right hand shows the hardware to the right hand, in particular the fifth metacarpal is intact.  I have discussed the findings with the patient. I have suggested to him to see Dr. Amanda Pea for reevaluation of his hand in hand pain.  Kathie Dike, PA-C 11/11/12 1306

## 2013-07-03 ENCOUNTER — Other Ambulatory Visit (HOSPITAL_COMMUNITY): Payer: Self-pay | Admitting: Pulmonary Disease

## 2013-07-03 ENCOUNTER — Ambulatory Visit (HOSPITAL_COMMUNITY)
Admission: RE | Admit: 2013-07-03 | Discharge: 2013-07-03 | Disposition: A | Discharge status: Home / Self Care | Payer: 59 | Source: Ambulatory Visit | Attending: Pulmonary Disease | Admitting: Pulmonary Disease

## 2013-07-03 DIAGNOSIS — M25561 Pain in right knee: Secondary | ICD-10-CM

## 2013-07-03 DIAGNOSIS — M25569 Pain in unspecified knee: Secondary | ICD-10-CM | POA: Insufficient documentation

## 2013-12-20 ENCOUNTER — Emergency Department (HOSPITAL_COMMUNITY): Payer: 59

## 2013-12-20 ENCOUNTER — Emergency Department (HOSPITAL_COMMUNITY)
Admission: EM | Admit: 2013-12-20 | Discharge: 2013-12-20 | Disposition: A | Discharge status: Home / Self Care | Payer: 59 | Attending: Emergency Medicine | Admitting: Emergency Medicine

## 2013-12-20 ENCOUNTER — Encounter (HOSPITAL_COMMUNITY): Payer: Self-pay | Admitting: Cardiology

## 2013-12-20 DIAGNOSIS — Y9389 Activity, other specified: Secondary | ICD-10-CM | POA: Insufficient documentation

## 2013-12-20 DIAGNOSIS — Y998 Other external cause status: Secondary | ICD-10-CM | POA: Diagnosis not present

## 2013-12-20 DIAGNOSIS — S92512A Displaced fracture of proximal phalanx of left lesser toe(s), initial encounter for closed fracture: Secondary | ICD-10-CM | POA: Diagnosis not present

## 2013-12-20 DIAGNOSIS — Y929 Unspecified place or not applicable: Secondary | ICD-10-CM | POA: Diagnosis not present

## 2013-12-20 DIAGNOSIS — Z720 Tobacco use: Secondary | ICD-10-CM | POA: Insufficient documentation

## 2013-12-20 DIAGNOSIS — S99921A Unspecified injury of right foot, initial encounter: Secondary | ICD-10-CM | POA: Diagnosis present

## 2013-12-20 DIAGNOSIS — W2203XA Walked into furniture, initial encounter: Secondary | ICD-10-CM | POA: Diagnosis not present

## 2013-12-20 DIAGNOSIS — T1490XA Injury, unspecified, initial encounter: Secondary | ICD-10-CM

## 2013-12-20 DIAGNOSIS — S92911A Unspecified fracture of right toe(s), initial encounter for closed fracture: Secondary | ICD-10-CM

## 2013-12-20 MED ORDER — HYDROCODONE-ACETAMINOPHEN 5-325 MG PO TABS: 1.0000 | ORAL_TABLET | ORAL | Status: DC | PRN

## 2013-12-20 MED ORDER — IBUPROFEN 600 MG PO TABS: 600.0000 mg | ORAL_TABLET | Freq: Four times a day (QID) | ORAL | Status: DC | PRN

## 2013-12-20 MED ORDER — IBUPROFEN 400 MG PO TABS: 400.0000 mg | ORAL_TABLET | Freq: Once | ORAL | Status: DC

## 2013-12-20 MED ORDER — IBUPROFEN 800 MG PO TABS
800.0000 mg | ORAL_TABLET | Freq: Once | ORAL | Status: AC
  Administered 2013-12-20: 800 mg via ORAL
  Filled 2013-12-20: qty 1

## 2013-12-20 NOTE — Discharge Instructions (Signed)
Buddy Taping of Toes °We have taped your toes together to keep them from moving. This is called "buddy taping" since we used a part of your own body to keep the injured part still. We placed soft padding between your toes to keep them from rubbing against each other. Buddy taping will help with healing and to reduce pain. Keep your toes buddy taped together for as long as directed by your caregiver. °HOME CARE INSTRUCTIONS  °· Raise your injured area above the level of your heart while sitting or lying down. Prop it up with pillows. °· An ice pack used every twenty minutes, while awake, for the first one to two days may be helpful. Put ice in a plastic bag and put a towel between the bag and your skin. °· Watch for signs that the taping is too tight. These signs may be: °· Numbness of your taped toes. °· Coolness of your taped toes. °· Color change in the area beyond the tape. °· Increased pain. °· If you have any of these signs, loosen or rewrap the tape. If you need to loosen or rewrap the buddy tape, make sure you use the padding again. °SEEK IMMEDIATE MEDICAL CARE IF:  °· You have worse pain, swelling, inflammation (soreness), drainage or bleeding after you rewrap the tape. °· Any new problems occur. °MAKE SURE YOU:  °· Understand these instructions. °· Will watch your condition. °· Will get help right away if you are not doing well or get worse. °Document Released: 09/24/2003 Document Revised: 03/14/2011 Document Reviewed: 12/18/2007 °ExitCare® Patient Information ©2015 [employer]. This information is not intended to replace advice given to you by your health care provider. Make sure you discuss any questions you have with your health care provider. ° °Toe Fracture °Your caregiver has diagnosed you as having a fractured toe. A toe fracture is a break in the bone of a toe. "Buddy taping" is a way of splinting your broken toe, by taping the broken toe to the toe next to it. This "buddy taping" will keep the  injured toe from moving beyond normal range of motion. Buddy taping also helps the toe heal in a more normal alignment. It may take 6 to 8 weeks for the toe injury to heal. °HOME CARE INSTRUCTIONS  °· Leave your toes taped together for as long as directed by your caregiver or until you see a doctor for a follow-up examination. You can change the tape after bathing. Always use a small piece of gauze or cotton between the toes when taping them together. This will help the skin stay dry and prevent infection. °· Apply ice to the injury for 15-20 minutes each hour while awake for the first 2 days. Put the ice in a plastic bag and place a towel between the bag of ice and your skin. °· After the first 2 days, apply heat to the injured area. Use heat for the next 2 to 3 days. Place a heating pad on the foot or soak the foot in warm water as directed by your caregiver. °· Keep your foot elevated as much as possible to lessen swelling. °· Wear sturdy, supportive shoes. The shoes should not pinch the toes or fit tightly against the toes. °· Your caregiver may prescribe a rigid shoe if your foot is very swollen. °· Your may be given crutches if the pain is too great and it hurts too much to walk. °· Only take over-the-counter or prescription medicines for pain, discomfort,   or fever as directed by your caregiver. °· If your caregiver has given you a follow-up appointment, it is very important to keep that appointment. Not keeping the appointment could result in a chronic or permanent injury, pain, and disability. If there is any problem keeping the appointment, you must call back to this facility for assistance. °SEEK MEDICAL CARE IF:  °· You have increased pain or swelling, not relieved with medications. °· The pain does not get better after 1 week. °· Your injured toe is cold when the others are warm. °SEEK IMMEDIATE MEDICAL CARE IF:  °· The toe becomes cold, numb, or white. °· The toe becomes hot (inflamed) and  red. °Document Released: 12/18/1999 Document Revised: 03/14/2011 Document Reviewed: 08/06/2007 °ExitCare® Patient Information ©2015 [employer]. This information is not intended to replace advice given to you by your health care provider. Make sure you discuss any questions you have with your health care provider. ° °You may take the hydrocodone prescribed for pain relief.  This will make you drowsy - do not drive within 4 hours of taking this medication. ° ° °

## 2013-12-20 NOTE — ED Provider Notes (Signed)
CSN: [id number]     Arrival date & time 12/20/13  [id number] History   First MD Initiated Contact with Patient 12/20/13 0830     Chief Complaint  Patient presents with  . Toe Injury     (Consider location/radiation/quality/duration/timing/severity/associated sxs/prior Treatment) The history is provided by the patient.   Daniel Hays is a 33 y.o. male presenting for evaluation of left foot and toe pain from injury sustained when he stubbed his toes last night on the frame of his bed.  He has pain and swelling along the 3rd through 5 toes despite using an ice pack.  He woke with blue discoloration on the top of his foot which concerns him.  He has taken no medicines prior to arrival. He denies radiation of pain which is constant and aching.     Past Medical History  Diagnosis Date  . QIONGEXB(284.1Headache(784.0)    Past Surgical History  Procedure Laterality Date  . Appendectomy    . Colonoscopy      Hx: of  . Orif wrist fracture Right 10/13/2012    Procedure: OPEN REDUCTION INTERNAL FIXATION (ORIF)RIGHT 5TH CARPAL METACARPAL FRACTURE HAND  ;  Surgeon: Dominica SeverinWilliam Gramig, MD;  Location: [facility] OR;  Service: Orthopedics;  Laterality: Right;   Family History  Problem Relation Age of Onset  . Cancer - Other Other    History  Substance Use Topics  . Smoking status: Current Every Day Smoker -- 1.00 packs/day    Types: Cigarettes  . Smokeless tobacco: Never Used  . Alcohol Use: Yes     Comment: occ use    Review of Systems  Constitutional: Negative for fever.  Musculoskeletal: Positive for joint swelling and arthralgias. Negative for myalgias.  Skin: Positive for color change.  Neurological: Negative for weakness and numbness.      Allergies  Review of patient's allergies indicates no known allergies.  Home Medications   Prior to Admission medications   Medication Sig Start Date End Date Taking? Authorizing Provider  HYDROcodone-acetaminophen (NORCO/VICODIN) 5-325 MG per tablet Take 1 tablet  by mouth every 4 (four) hours as needed. 12/20/13   Burgess AmorJulie Benino Korinek, PA-C  ibuprofen (ADVIL,MOTRIN) 600 MG tablet Take 1 tablet (600 mg total) by mouth every 6 (six) hours as needed. 12/20/13   Burgess AmorJulie Rashanna Christiana, PA-C  oxyCODONE (OXY IR/ROXICODONE) 5 MG immediate release tablet Take 1 tablet (5 mg total) by mouth every 4 (four) hours as needed (Take 1 to 2 every 4 hours for pain as needed). Patient not taking: Reported on 12/20/2013 10/14/12   Sheran LawlessBrian L Buchanan, PA-C   BP 132/89 mmHg  Pulse 84  Temp(Src) 98.2 F (36.8 C) (Oral)  Resp 18  Ht 6" (0.152 m)  Wt 205 lb (92.987 kg)  BMI 4024.71 kg/m2  SpO2 94% Physical Exam  Constitutional: He appears well-developed and well-nourished.  HENT:  Head: Atraumatic.  Neck: Normal range of motion.  Cardiovascular:  Pulses equal bilaterally  Musculoskeletal: He exhibits edema and tenderness.       Left foot: There is normal capillary refill.       Feet:  Mild contusions noted at the bases of third fourth and fifth toes of left foot.  There is a teal colored discoloration on his dorsal foot which washes away with alcohol swab.  (Patient endorses he used a teal colored ice pack last night ).  Dorsalis pedis pulses are intact bilaterally.  Neurological: He is alert. He has normal strength. He displays normal reflexes. No sensory deficit.  Skin: Skin is warm and dry.  Psychiatric: He has a normal mood and affect.    ED Course  Procedures (including critical care time) Labs Review Labs Reviewed - No data to display  Imaging Review Dg Foot Complete Left  12/20/2013   CLINICAL DATA:  Lateral toe pain following injury. Initial encounter.  EXAM: LEFT FOOT - COMPLETE 3+ VIEW  COMPARISON:  06/03/2007.  FINDINGS: There is a nondisplaced extra-articular fracture involving the proximal fifth phalanx. No other fractures are identified. There is no subluxation. Mild degenerative changes are present at the first metatarsal phalangeal joint. The tibial sesamoid of the  first metatarsal is bipartite.  IMPRESSION: Nondisplaced acute extra-articular fracture of the fifth proximal phalanx.   Electronically Signed   By: Roxy HorsemanBill  Veazey M.D.   On: 12/20/2013 09:14     EKG Interpretation None      MDM   Final diagnoses:  Trauma  Toe fracture, right, closed, initial encounter    Post op shoe, buddy tape, ice, elevation.  Ibuprofen and hydrocodone prescribed.  Patient encouraged to follow-up with his PCP for further management of this injury.  Patients labs and/or radiological studies were viewed and considered during the medical decision making and disposition process.     Burgess AmorJulie Herold Salguero, PA-C 12/20/13 [id number]  Glynn OctaveStephen Rancour, MD 12/20/13 [phone]

## 2013-12-20 NOTE — ED Notes (Signed)
Hit left foot last night on bed.  Pain to 3 ,4 and 5 th toe.  Swelling and bruising.

## 2014-04-04 ENCOUNTER — Ambulatory Visit (HOSPITAL_COMMUNITY)
Admission: RE | Admit: 2014-04-04 | Discharge: 2014-04-04 | Disposition: A | Discharge status: Home / Self Care | Payer: 59 | Source: Ambulatory Visit | Attending: Pulmonary Disease | Admitting: Pulmonary Disease

## 2014-04-04 ENCOUNTER — Other Ambulatory Visit (HOSPITAL_COMMUNITY): Payer: Self-pay | Admitting: Pulmonary Disease

## 2014-04-04 DIAGNOSIS — X58XXXA Exposure to other specified factors, initial encounter: Secondary | ICD-10-CM | POA: Insufficient documentation

## 2014-04-04 DIAGNOSIS — M79642 Pain in left hand: Secondary | ICD-10-CM

## 2014-04-04 DIAGNOSIS — M25532 Pain in left wrist: Secondary | ICD-10-CM | POA: Diagnosis not present

## 2015-08-05 ENCOUNTER — Emergency Department (HOSPITAL_COMMUNITY): Payer: 59

## 2015-08-05 ENCOUNTER — Encounter (HOSPITAL_COMMUNITY): Payer: Self-pay | Admitting: Emergency Medicine

## 2015-08-05 ENCOUNTER — Emergency Department (HOSPITAL_COMMUNITY)
Admission: EM | Admit: 2015-08-05 | Discharge: 2015-08-05 | Disposition: A | Discharge status: Home / Self Care | Payer: 59 | Attending: Emergency Medicine | Admitting: Emergency Medicine

## 2015-08-05 DIAGNOSIS — M7052 Other bursitis of knee, left knee: Secondary | ICD-10-CM

## 2015-08-05 DIAGNOSIS — F17210 Nicotine dependence, cigarettes, uncomplicated: Secondary | ICD-10-CM | POA: Insufficient documentation

## 2015-08-05 DIAGNOSIS — Z79899 Other long term (current) drug therapy: Secondary | ICD-10-CM | POA: Diagnosis not present

## 2015-08-05 DIAGNOSIS — M25462 Effusion, left knee: Secondary | ICD-10-CM

## 2015-08-05 DIAGNOSIS — M25562 Pain in left knee: Secondary | ICD-10-CM | POA: Diagnosis present

## 2015-08-05 DIAGNOSIS — Y9389 Activity, other specified: Secondary | ICD-10-CM | POA: Insufficient documentation

## 2015-08-05 DIAGNOSIS — Z791 Long term (current) use of non-steroidal anti-inflammatories (NSAID): Secondary | ICD-10-CM | POA: Insufficient documentation

## 2015-08-05 DIAGNOSIS — M7042 Prepatellar bursitis, left knee: Secondary | ICD-10-CM | POA: Diagnosis not present

## 2015-08-05 HISTORY — DX: Pain in unspecified knee: M25.569

## 2015-08-05 HISTORY — DX: Other chronic pain: G89.29

## 2015-08-05 LAB — GRAM STAIN: Gram Stain: NONE SEEN

## 2015-08-05 MED ORDER — LIDOCAINE-EPINEPHRINE (PF) 1 %-1:200000 IJ SOLN
INTRAMUSCULAR | Status: AC
  Filled 2015-08-05: qty 30

## 2015-08-05 MED ORDER — LIDOCAINE-EPINEPHRINE (PF) 1 %-1:200000 IJ SOLN
10.0000 mL | Freq: Once | INTRAMUSCULAR | Status: DC
  Filled 2015-08-05: qty 10

## 2015-08-05 NOTE — Discharge Instructions (Signed)
I have sent a sample from your knee to the lab to determine if the fluid is infectious in nature. If it is you will receive a phone call. Follow-up with Dr. Romeo Apple, orthopedics, to have your knee reevaluated within 2-3 days.  Return to the department if you experience increased swelling, redness, warmth, pain to your knee, fever or chills, or any other concerning symptoms.

## 2015-08-05 NOTE — ED Provider Notes (Signed)
[facility]-EMER[facility]ENCY DEPT Provider Note   CSN: [id number] Arrival date & time: 08/05/15  1738  First Provider Contact:  None       History   Chief Complaint Chief Complaint  Patient presents with  . Knee Pain    HPI Daniel Hays is a 35 y.o. male.  HPI   Patient is a 35 year old male who presents with progressively worsening swelling to his anterior left knee for the last 2 weeks. Patient denies pain. He states he's had this issue before. Last time was roughly 2 months ago. His PCP has drained his knee in the past but states last time he had issues because the fluid was too thick for him to draw out. 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 He states he's never had have antibiotics for this before. No associated symptoms. Patient states full ROM of his knee. Patient denies fever, chills, redness, nausea, vomiting.  Past Medical History:  Diagnosis Date  . Chronic knee pain   . Headache(784.0)     There are no active problems to display for this patient.   Past Surgical History:  Procedure Laterality Date  . APPENDECTOMY    . COLONOSCOPY     Hx: of  . ORIF WRIST FRACTURE Right 10/13/2012   Procedure: OPEN REDUCTION INTERNAL FIXATION (ORIF)RIGHT 5TH CARPAL METACARPAL FRACTURE HAND  ;  Surgeon: Dominica Severin, MD;  Location: [facility] OR;  Service: Orthopedics;  Laterality: Right;       Home Medications    Prior to Admission medications   Medication Sig Start Date End Date Taking? Authorizing Provider  HYDROcodone-acetaminophen (NORCO/VICODIN) 5-325 MG per tablet Take 1 tablet by mouth every 4 (four) hours as needed. 12/20/13   Burgess Amor, PA-C  ibuprofen (ADVIL,MOTRIN) 600 MG tablet Take 1 tablet (600 mg total) by mouth every 6 (six) hours as needed. 12/20/13   Burgess Amor, PA-C  oxyCODONE (OXY IR/ROXICODONE) 5 MG immediate release tablet Take 1 tablet (5 mg total) by mouth every 4 (four) hours as needed (Take 1 to 2 every 4 hours for pain as needed). Patient not taking:  Reported on 12/20/2013 10/14/12   Karie Chimera, PA-C    Family History Family History  Problem Relation Age of Onset  . Cancer - Other Other     Social History Social History  Substance Use Topics  . Smoking status: Current Every Day Smoker    Packs/day: 1.00    Types: Cigarettes  . Smokeless tobacco: Never Used  . Alcohol use Yes     Comment: occ use     Allergies   Avelox [moxifloxacin hcl in nacl]   Review of Systems Review of Systems  Constitutional: Negative for chills and fever.  Gastrointestinal: Negative for nausea and vomiting.  Musculoskeletal: Positive for joint swelling. Negative for arthralgias and gait problem.  Skin: Negative for color change and wound.  Neurological: Negative for weakness and numbness.     Physical Exam Updated Vital Signs BP 126/83 (BP Location: Left Arm)   Pulse 88   Temp 98.3 F (36.8 C) (Oral)   Resp 18   Ht 6' (1.829 m)   Wt 104.3 kg   SpO2 97%   BMI 31.19 kg/m   Physical Exam  Constitutional: He appears well-developed and well-nourished. No distress.  HENT:  Head: Normocephalic and atraumatic.  Eyes: Conjunctivae are normal.  Cardiovascular:  Pulses:      Posterior tibial pulses are 2+ on the right side, and 2+ on the  left side.  Pulmonary/Chest: Effort normal. No respiratory distress.  Musculoskeletal: Normal range of motion.  Examination of left knee revealed effusion of the patellar area, no erythema, no increased warmth, no red streaking, full range of motion, no TTP, patient neurovascular intact distally  Neurological: He is alert. Coordination normal.  Skin: Skin is warm and dry. He is not diaphoretic.  Psychiatric: He has a normal mood and affect. His behavior is normal.  Nursing note and vitals reviewed.    ED Treatments / Results  Labs (all labs ordered are listed, but only abnormal results are displayed) Labs Reviewed  BODY FLUID CULTURE    EKG  EKG Interpretation None        Radiology Dg Knee Complete 4 Views Left  Result Date: 08/05/2015 CLINICAL DATA:  Left knee swelling. EXAM: LEFT KNEE - COMPLETE 4+ VIEW COMPARISON:  None. FINDINGS: Extensive prepatellar soft tissue swelling is present. The knee is located. There is no significant joint effusion. No acute osseous abnormality is present. IMPRESSION: 1. Extensive prepatellar soft tissue swelling without acute or focal osseous abnormality. Electronically Signed   By: Marin Roberts M.D.   On: 08/05/2015 18:58    Procedures .Joint Aspiration/Arthrocentesis Date/Time: 08/05/2015 7:40 PM Performed by: Mattie Marlin L Authorized by: Mattie Marlin L   Consent:    Consent obtained:  Verbal   Consent given by:  Patient   Risks discussed:  Bleeding, infection, pain and incomplete drainage   Alternatives discussed:  Referral Location:    Location:  Knee   Knee:  L knee (patella bursa) Anesthesia (see MAR for exact dosages):    Anesthesia method:  Local infiltration   Local anesthetic:  Lidocaine 1% WITH epi Procedure details:    Preparation: Patient was prepped and draped in usual sterile fashion     Needle gauge:  18 G   Ultrasound guidance: no     Approach:  Lateral   Aspirate amount:  25cc   Aspirate characteristics:  Blood-tinged and clear   Steroid injected: no   Post-procedure details:    Dressing:  Adhesive bandage (ace wrap)   Patient tolerance of procedure:  Tolerated well, no immediate complications Comments:     Sent sample for culture    (including critical care time)  Medications Ordered in ED Medications  lidocaine-EPINEPHrine (XYLOCAINE-EPINEPHrine) 1 %-1:200000 (PF) injection 10 mL (not administered)  lidocaine-EPINEPHrine (XYLOCAINE-EPINEPHrine) 1 %-1:200000 (PF) injection (not administered)     Initial Impression / Assessment and Plan / ED Course  I have reviewed the triage vital signs and the nursing notes.  Pertinent labs & imaging results that were available  during my care of the patient were reviewed by me and considered in my medical decision making (see chart for details).  Clinical Course   Patient with a history of patellar bursitis. X-ray reviewed by me revealed Extensive prepatellar soft tissue swelling without acute bony abnormality. Aspiration resulted in 25 mL of fluid. Sent a sample for culture. No increased warmth, or redness and patient without pain this is less concerning for septic patellar bursitis. Pt afebrile, VSS. Will not discharge with antibiotics at this time as I feel that are not indicated. Instructed the patient to follow-up with Dr. Romeo Apple, orthopedics to be reevaluated within 2-3 days. Discussed strict return precautions to the ED. Patient expressed understanding to the discharge instructions.  Pt case discussed and pt seen by Dr. Estell Harpin who agrees with the above plan.   Final Clinical Impressions(s) / ED Diagnoses   Final diagnoses:  Patellar bursitis of left knee  Knee swelling, left    New Prescriptions New Prescriptions   No medications on file     Jerre Simon, [place] 08/05/15 1949    Bethann Berkshire, MD 08/06/15 1525

## 2015-08-05 NOTE — ED Triage Notes (Signed)
Pt reports left knee swelling for last several days. Pt reports history of same. Pt denies any known injury.

## 2015-08-10 ENCOUNTER — Ambulatory Visit (INDEPENDENT_AMBULATORY_CARE_PROVIDER_SITE_OTHER): Payer: 59 | Admitting: Orthopedic Surgery

## 2015-08-10 ENCOUNTER — Encounter: Payer: Self-pay | Admitting: Orthopedic Surgery

## 2015-08-10 ENCOUNTER — Telehealth: Payer: Self-pay | Admitting: Orthopedic Surgery

## 2015-08-10 VITALS — BP 120/84 | HR 92 | Ht 73.0 in | Wt 220.0 lb

## 2015-08-10 DIAGNOSIS — M7052 Other bursitis of knee, left knee: Secondary | ICD-10-CM

## 2015-08-10 LAB — CULTURE, BODY FLUID W GRAM STAIN -BOTTLE: Culture: NO GROWTH

## 2015-08-10 LAB — CULTURE, BODY FLUID-BOTTLE

## 2015-08-10 MED ORDER — PREDNISONE 10 MG PO TABS: 10.0000 mg | ORAL_TABLET | Freq: Every day | ORAL | 0 refills | Status: DC

## 2015-08-10 NOTE — Telephone Encounter (Signed)
IT WAS SENT TO [facility], THAT WAS THE PHARMACY LISTED IN CHART

## 2015-08-10 NOTE — Progress Notes (Signed)
Chief Complaint  Patient presents with  . Knee Pain    ER FOLLOW UP LEFT KNEE PAIN/SWELLING   HPI   35 year old male presents with a 2 to three-month history of pain and swelling over his left knee. He's had 3 attempts at aspiration with recurrence of fluid. 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 It's worse when he crawls and kneels it's worsening and not improving  Review of Systems  Musculoskeletal: Positive for joint pain.  All other systems reviewed and are negative.   Past Medical History:  Diagnosis Date  . Chronic knee pain   . ZOXWRUEA(540.9)     Past Surgical History:  Procedure Laterality Date  . APPENDECTOMY    . COLONOSCOPY     Hx: of  . ORIF WRIST FRACTURE Right 10/13/2012   Procedure: OPEN REDUCTION INTERNAL FIXATION (ORIF)RIGHT 5TH CARPAL METACARPAL FRACTURE HAND  ;  Surgeon: Dominica Severin, MD;  Location: [facility] OR;  Service: Orthopedics;  Laterality: Right;   Family History  Problem Relation Age of Onset  . Cancer - Other Other    Social History  Substance Use Topics  . Smoking status: Current Every Day Smoker    Packs/day: 1.00    Types: Cigarettes  . Smokeless tobacco: Never Used  . Alcohol use Yes     Comment: occ use    Current Outpatient Prescriptions:  .  HYDROcodone-acetaminophen (NORCO/VICODIN) 5-325 MG per tablet, Take 1 tablet by mouth every 4 (four) hours as needed., Disp: 15 tablet, Rfl: 0 .  ibuprofen (ADVIL,MOTRIN) 600 MG tablet, Take 1 tablet (600 mg total) by mouth every 6 (six) hours as needed., Disp: 30 tablet, Rfl: 0 .  oxyCODONE (OXY IR/ROXICODONE) 5 MG immediate release tablet, Take 1 tablet (5 mg total) by mouth every 4 (four) hours as needed (Take 1 to 2 every 4 hours for pain as needed). (Patient not taking: Reported on 12/20/2013), Disp: 40 tablet, Rfl: 0  BP 120/84   Pulse 92   Ht  (1.854 m)   Wt 220 lb (99.8 kg)   BMI 29.03 kg/m   Physical Exam   Constitutional: He is oriented to person, place, and time. He appears well-developed and well-nourished. No distress.  Cardiovascular: Normal rate and intact distal pulses.   Neurological: He is alert and oriented to person, place, and time.  Skin: Skin is warm and dry. No rash noted. He is not diaphoretic. No erythema. No pallor.  Psychiatric: He has a normal mood and affect. His behavior is normal. Judgment and thought content normal.    Ortho Exam Right and left upper extremity inspection reveals no abnormalities range of motion all joints all joints are reduced and stable muscle tone normal skin intact pulses good lymph nodes negative sensation normal  Right knee inspection normal full range of motion ligament stable strength normal skin normal pulses normal lymph nodes groin normal sensation normal no pathologic reflexes normal deep tendon reflexes he has normal coordination and balance  Left knee has a large swollen prepatellar bursa tender no erythema range of motion. Stability normal strength normal skin normal pulses normal lymph nodes normal in the groin sensation normal no pathologic reflexes normal deep tendon reflexes  ASSESSMENT: My personal interpretation of the images:  Soft tissue swelling anterior to the patella no knee joint effusion. No fracture dislocation. Joint spaces are preserved and normal  4 views of the knee included AP oblique views and lateral  EXAM: LEFT KNEE - COMPLETE 4+ VIEW   COMPARISON:  None.   FINDINGS: Extensive prepatellar soft tissue swelling is present. The knee is located. There is no significant joint effusion. No acute osseous abnormality is present.   IMPRESSION: 1. Extensive prepatellar soft tissue swelling without acute or focal osseous abnormality.     Electronically Signed   By: Marin Robertshristopher  Mattern M.D.   On: 08/05/2015 18:58     PLAN Aspiration injection left knee prepatellar bursa  Start prednisone 10 mg  Call  patient for range left knee prepatellar bursectomy   Procedure note left knee injection verbal consent was obtained to inject left knee bursa Timeout was completed to confirm the site of injection  The medications used were 40 mg of Depo-Medrol and 1% lidocaine 3 cc  Anesthesia was provided by ethyl chloride and the skin was prepped with alcohol.  After cleaning the skin with alcohol a 18-gauge needle was used to aspirate left prepatellar bursa. We took off 25 mL of blood injected 40 mg of Depo-Medrol  Compression bandage   [id number]  Fuller CanadaStanley Sharlet Notaro, MD 08/10/2015 11:44 AM

## 2015-08-10 NOTE — Patient Instructions (Signed)
You have received an injection of steroids into the joint. 15% of patients will have increased pain within the 24 hours postinjection.   This is transient and will go away.   We recommend that you use ice packs on the injection site for 20 minutes every 2 hours and extra strength Tylenol 2 tablets every 8 as needed until the pain resolves.  If you continue to have pain after taking the Tylenol and using the ice please call the office for further instructions.  We will call you to set up surgery  Pickup medicine from pharmacy

## 2015-08-10 NOTE — Telephone Encounter (Signed)
Patient called and stated that he was under the impression that Dr. Romeo AppleHarrison was going to send a prescription for Prednisone to [facility] but this has not been done.  Please let him know if this is done

## 2015-08-11 NOTE — Telephone Encounter (Signed)
*   Patient aware.  States for future prescriptions, wants to have his pharmacy listed as [facility] * * Also: patient relays he does wish to schedule surgery for 08/21/15 as discussed.  Please advise.

## 2015-08-11 NOTE — Addendum Note (Signed)
Addended by: Adella HareBOOTHE, JAIME B on: 08/11/2015 05:19 PM   Modules accepted: Orders, SmartSet

## 2015-08-12 NOTE — Telephone Encounter (Signed)
Preferred pharmacy changed in chart  Surgery scheduled, will call patient with pre op appt

## 2015-08-18 NOTE — Patient Instructions (Signed)
Daniel Hays  08/18/2015     @PREFPERIOPPHARMACY @   Your procedure is scheduled on  08/21/2015   Report to [facility] at  700  A.M.  Call this number if you have problems the morning of surgery:  [phone]   Remember:  Do not eat food or drink liquids after midnight.  Take these medicines the morning of surgery with A SIP OF WATER  Hydrocodone or oxycodone, deltasone.   Do not wear jewelry, make-up or nail polish.  Do not wear lotions, powders, or perfumes.  You may wear deoderant.  Do not shave 48 hours prior to surgery.  Men may shave face and neck.  Do not bring valuables to the hospital.  [facility] is not responsible for any belongings or valuables.  Contacts, dentures or bridgework may not be worn into surgery.  Leave your suitcase in the car.  After surgery it may be brought to your room.  For patients admitted to the hospital, discharge time will be determined by your treatment team.  Patients discharged the day of surgery will not be allowed to drive home.   Name and phone number of your driver:   none Special instructions:  family  Please read over the following fact sheets that you were given. Anesthesia Post-op Instructions and Care and Recovery After Surgery       Prepatellar Bursitis With Rehab  Bursitis is a condition that is characterized by inflammation of a bursa. Daniel Hays exists in many areas of the body. They are fluid-filled sacs that lie between a soft tissue (skin, tendon, or ligament) and a bone, and they reduce friction between the structures as well as the stress placed on the soft tissue. Prepatellar bursitis is inflammation of the bursa that lies between the skin and the kneecap (patella). This condition often causes pain over the patella. SYMPTOMS   Pain, tenderness, and/or inflammation over the patella.  Pain that worsens with movement of the knee joint.  Decreased range of motion for the knee joint.  A crackling sound  (crepitation) when the bursa is moved or touched.  Occasionally, painless swelling of the bursa.  Fever (when infected). CAUSES  Bursitis is caused by damage to the bursa, which results in an inflammatory response. Common mechanisms of injury include:  Direct trauma to the front of the knee.  Repetitive and/or stressful use of the knee. RISK INCREASES WITH:  Activities in which kneeling and/or falling on one's knees is likely (volleyball or football).  Repetitive and stressful training, especially if it involves running on hills.  Improper training techniques, such as a sudden increase in the intensity, frequency, or duration of training.  Failure to warm up properly before activity.  Poor technique.  Artificial turf. PREVENTION   Avoid kneeling or falling on your knees.  Warm up and stretch properly before activity.  Allow for adequate recovery between workouts.  Maintain physical fitness:  Strength, flexibility, and endurance.  Cardiovascular fitness.  Learn and use proper technique. When possible, have a coach correct improper technique.  Wear properly fitted and padded protective equipment (knee pads). PROGNOSIS  If treated properly, then the symptoms of prepatellar bursitis usually resolve within 2 weeks. RELATED COMPLICATIONS   Recurrent symptoms that result in a chronic problem.  Prolonged healing time, if improperly treated or reinjured.  Limited range of motion.  Infection of bursa.  Chronic inflammation or scarring of bursa. TREATMENT  Treatment initially involves the use of ice and medication  to help reduce pain and inflammation. The use of strengthening and stretching exercises may help reduce pain with activity, especially those of the quadriceps and hamstring muscles. These exercises may be performed at home or with referral to a therapist. Your caregiver may recommend knee pads when you return to playing sports, in order to reduce the stress on  the prepatellar bursa. If symptoms persist despite treatment, then your caregiver may drain fluid out with a needle (aspirate) the bursa. If symptoms persist for greater than 6 months despite nonsurgical (conservative) treatment, then surgery may be recommended to remove the bursa.  MEDICATION  If pain medication is necessary, then nonsteroidal anti-inflammatory medications, such as aspirin and ibuprofen, or other minor pain relievers, such as acetaminophen, are often recommended.  Do not take pain medication for 7 days before surgery.  Prescription pain relievers may be given if deemed necessary by your caregiver. Use only as directed and only as much as you need.  Corticosteroid injections may be given by your caregiver. These injections should be reserved for the most serious cases, because they may only be given a certain number of times. HEAT AND COLD  Cold treatment (icing) relieves pain and reduces inflammation. Cold treatment should be applied for 10 to 15 minutes every 2 to 3 hours for inflammation and pain and immediately after any activity that aggravates your symptoms. Use ice packs or massage the area with a piece of ice (ice massage).  Heat treatment may be used prior to performing the stretching and strengthening activities prescribed by your caregiver, physical therapist, or athletic trainer. Use a heat pack or soak the injury in warm water. SEEK MEDICAL CARE IF:  Treatment seems to offer no benefit, or the condition worsens.  Any medications produce adverse side effects. EXERCISES RANGE OF MOTION (ROM) AND STRETCHING EXERCISES - Prepatellar Bursitis These exercises may help you when beginning to rehabilitate your injury. Your symptoms may resolve with or without further involvement from your physician, physical therapist or athletic trainer. While completing these exercises, remember:   Restoring tissue flexibility helps normal motion to return to the joints. This allows  healthier, less painful movement and activity.  An effective stretch should be held for at least 30 seconds.  A stretch should never be painful. You should only feel a gentle lengthening or release in the stretched tissue. STRETCH - Hamstrings, Standing  Stand or sit and extend your right / left leg, placing your foot on a chair or foot stool  Keeping a slight arch in your low back and your hips straight forward.  Lead with your chest and lean forward at the waist until you feel a gentle stretch in the back of your right / left knee or thigh. (When done correctly, this exercise requires leaning only a small distance.)  Hold this position for __________ seconds. Repeat __________ times. Complete this stretch __________ times per day. STRETCH - Quadriceps, Prone   Lie on your stomach on a firm surface, such as a bed or padded floor.  Bend your right / left knee and grasp your ankle. If you are unable to reach, your ankle or pant leg, use a belt around your foot to lengthen your reach.  Gently pull your heel toward your buttocks. Your knee should not slide out to the side. You should feel a stretch in the front of your thigh and/or knee.  Hold this position for __________ seconds. Repeat __________ times. Complete this stretch __________ times per day.  STRETCH -  Hamstrings/Adductors, V-Sit   Sit on the floor with your legs extended in a large "V," keeping your knees straight.  With your head and chest upright, bend at your waist reaching for your right foot to stretch your left adductors.  You should feel a stretch in your left inner thigh. Hold for __________ seconds.  Return to the upright position to relax your leg muscles.  Continuing to keep your chest upright, bend straight forward at your waist to stretch your hamstrings.  You should feel a stretch behind both of your thighs and/or knees. Hold for __________ seconds.  Return to the upright position to relax your leg  muscles.  Repeat steps 2 through 4. Repeat __________ times. Complete this exercise __________ times per day.  STRENGTHENING EXERCISES - Prepatellar Bursitis  These exercises may help you when beginning to rehabilitate your injury. They may resolve your symptoms with or without further involvement from your physician, physical therapist or athletic trainer. While completing these exercises, remember:  Muscles can gain both the endurance and the strength needed for everyday activities through controlled exercises.  Complete these exercises as instructed by your physician, physical therapist or athletic trainer. Progress the resistance and repetitions only as guided. STRENGTH - Quadriceps, Isometrics  Lie on your back with your right / left leg extended and your opposite knee bent.  Gradually tense the muscles in the front of your right / left thigh. You should see either your kneecap slide up toward your hip or increased dimpling just above the knee. This motion will push the back of the knee down toward the floor/mat/bed on which you are lying.  Hold the muscle as tight as you can without increasing your pain for __________ seconds.  Relax the muscles slowly and completely in between each repetition. Repeat __________ times. Complete this exercise __________ times per day.  STRENGTH - Quadriceps, Short Arcs   Lie on your back. Place a __________ inch towel roll under your knee so that the knee slightly bends.  Raise only your lower leg by tightening the muscles in the front of your thigh. Do not allow your thigh to rise.  Hold this position for __________ seconds. Repeat __________ times. Complete this exercise __________ times per day.  OPTIONAL ANKLE WEIGHTS: Begin with ____________________, but DO NOT exceed ____________________. Increase in1 lb/0.5 kg increments.  STRENGTH - Quadriceps, Straight Leg Raises  Quality counts! Watch for signs that the quadriceps muscle is working to  insure you are strengthening the correct muscles and not "cheating" by substituting with healthier muscles.  Lay on your back with your right / left leg extended and your opposite knee bent.  Tense the muscles in the front of your right / left thigh. You should see either your kneecap slide up or increased dimpling just above the knee. Your thigh may even quiver.  Tighten these muscles even more and raise your leg 4 to 6 inches off the floor. Hold for __________ seconds.  Keeping these muscles tense, lower your leg.  Relax the muscles slowly and completely in between each repetition. Repeat __________ times. Complete this exercise __________ times per day.  STRENGTH - Quadriceps, Step-Ups   Use a thick book, step or step stool that is __________ inches tall.  Holding a wall or counter for balance only, not support.  Slowly step-up with your right / left foot, keeping your knee in line with your hip and foot. Do not allow your knee to bend so far that you cannot see  your toes.  Slowly unlock your knee and lower yourself to the starting position. Your muscles, not gravity, should lower you. Repeat __________ times. Complete this exercise __________ times per day.   This information is not intended to replace advice given to you by your health care provider. Make sure you discuss any questions you have with your health care provider.   Document Released: 12/20/2004 Document Revised: 09/10/2014 Document Reviewed: 04/03/2008 Elsevier Interactive Patient Education 2016 [employer] PATIENT INSTRUCTIONS POST-ANESTHESIA  IMMEDIATELY FOLLOWING SURGERY:  Do not drive or operate machinery for the first twenty four hours after surgery.  Do not make any important decisions for twenty four hours after surgery or while taking narcotic pain medications or sedatives.  If you develop intractable nausea and vomiting or a severe headache please notify your doctor immediately.  FOLLOW-UP:  Please make  an appointment with your surgeon as instructed. You do not need to follow up with anesthesia unless specifically instructed to do so.  WOUND CARE INSTRUCTIONS (if applicable):  Keep a dry clean dressing on the anesthesia/puncture wound site if there is drainage.  Once the wound has quit draining you may leave it open to air.  Generally you should leave the bandage intact for twenty four hours unless there is drainage.  If the epidural site drains for more than 36-48 hours please call the anesthesia department.  QUESTIONS?:  Please feel free to call your physician or the hospital operator if you have any questions, and they will be happy to assist you.

## 2015-08-19 ENCOUNTER — Encounter (HOSPITAL_COMMUNITY): Payer: Self-pay

## 2015-08-19 ENCOUNTER — Encounter (HOSPITAL_COMMUNITY)
Admission: RE | Admit: 2015-08-19 | Discharge: 2015-08-19 | Disposition: A | Discharge status: Home / Self Care | Payer: 59 | Source: Ambulatory Visit | Attending: Orthopedic Surgery | Admitting: Orthopedic Surgery

## 2015-08-19 DIAGNOSIS — F17210 Nicotine dependence, cigarettes, uncomplicated: Secondary | ICD-10-CM | POA: Diagnosis not present

## 2015-08-19 DIAGNOSIS — Z79899 Other long term (current) drug therapy: Secondary | ICD-10-CM | POA: Diagnosis not present

## 2015-08-19 DIAGNOSIS — M7042 Prepatellar bursitis, left knee: Secondary | ICD-10-CM | POA: Diagnosis not present

## 2015-08-19 DIAGNOSIS — G8929 Other chronic pain: Secondary | ICD-10-CM | POA: Diagnosis present

## 2015-08-19 DIAGNOSIS — R51 Headache: Secondary | ICD-10-CM | POA: Diagnosis not present

## 2015-08-19 LAB — CBC WITH DIFFERENTIAL/PLATELET
BASOS ABS: 0.1 10*3/uL (ref 0.0–0.1)
Basophils Relative: 1 %
Eosinophils Absolute: 0.1 10*3/uL (ref 0.0–0.7)
Eosinophils Relative: 2 %
HEMATOCRIT: 46.2 % (ref 39.0–52.0)
Hemoglobin: 15.5 g/dL (ref 13.0–17.0)
LYMPHS ABS: 2.8 10*3/uL (ref 0.7–4.0)
LYMPHS PCT: 32 %
MCH: 31.6 pg (ref 26.0–34.0)
MCHC: 33.5 g/dL (ref 30.0–36.0)
MCV: 94.3 fL (ref 78.0–100.0)
MONO ABS: 0.8 10*3/uL (ref 0.1–1.0)
Monocytes Relative: 9 %
NEUTROS ABS: 5.1 10*3/uL (ref 1.7–7.7)
Neutrophils Relative %: 56 %
Platelets: 289 10*3/uL (ref 150–400)
RBC: 4.9 MIL/uL (ref 4.22–5.81)
RDW: 12.7 % (ref 11.5–15.5)
WBC: 8.9 10*3/uL (ref 4.0–10.5)

## 2015-08-19 LAB — BASIC METABOLIC PANEL
ANION GAP: 7 (ref 5–15)
BUN: 15 mg/dL (ref 6–20)
CALCIUM: 8.9 mg/dL (ref 8.9–10.3)
CO2: 22 mmol/L (ref 22–32)
Chloride: 108 mmol/L (ref 101–111)
Creatinine, Ser: 0.82 mg/dL (ref 0.61–1.24)
GFR calc Af Amer: >60 mL/min (ref >60)
GFR calc non Af Amer: >60 mL/min (ref >60)
GLUCOSE: 116 mg/dL — AB (ref 65–99)
POTASSIUM: 3.9 mmol/L (ref 3.5–5.1)
Sodium: 137 mmol/L (ref 135–145)

## 2015-08-19 NOTE — Pre-Procedure Instructions (Signed)
Patient given information to sign up for my chart at home. 

## 2015-08-20 NOTE — H&P (Signed)
Chief Complaint  Patient presents with  . Knee Pain      ER FOLLOW UP LEFT KNEE PAIN/SWELLING    HPI    35 year old male presents with a 2 to three-month history of pain and swelling over his left knee. He's had 3 attempts at aspiration with recurrence of fluid. 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 It's worse when he crawls and kneels it's worsening and not improving   Review of Systems  Musculoskeletal: Positive for joint pain.  All other systems reviewed and are negative.         Past Medical History:  Diagnosis Date  . Chronic knee pain    . GNFAOZHY(865.7Headache(784.0)             Past Surgical History:  Procedure Laterality Date  . APPENDECTOMY      . COLONOSCOPY        Hx: of  . ORIF WRIST FRACTURE Right 10/13/2012    Procedure: OPEN REDUCTION INTERNAL FIXATION (ORIF)RIGHT 5TH CARPAL METACARPAL FRACTURE HAND  ;  Surgeon: Dominica SeverinWilliam Gramig, MD;  Location: [facility] OR;  Service: Orthopedics;  Laterality: Right;         Family History  Problem Relation Age of Onset  . Cancer - Other Other             Social History  Substance Use Topics  . Smoking status: Current Every Day Smoker      Packs/day: 1.00      Types: Cigarettes  . Smokeless tobacco: Never Used  . Alcohol use Yes         Comment: occ use      Current Outpatient Prescriptions:  .  HYDROcodone-acetaminophen (NORCO/VICODIN) 5-325 MG per tablet, Take 1 tablet by mouth every 4 (four) hours as needed., Disp: 15 tablet, Rfl: 0 .  ibuprofen (ADVIL,MOTRIN) 600 MG tablet, Take 1 tablet (600 mg total) by mouth every 6 (six) hours as needed., Disp: 30 tablet, Rfl: 0 .  oxyCODONE (OXY IR/ROXICODONE) 5 MG immediate release tablet, Take 1 tablet (5 mg total) by mouth every 4 (four) hours as needed (Take 1 to 2 every 4 hours for pain as needed). (Patient not taking: Reported on 12/20/2013), Disp: 40 tablet, Rfl: 0   BP 120/84   Pulse 92   Ht 6\' 1"  (1.854 m)   Wt 220  lb (99.8 kg)   BMI 29.03 kg/m    Physical Exam  Constitutional: He is oriented to person, place, and time. He appears well-developed and well-nourished. No distress.  Cardiovascular: Normal rate and intact distal pulses.   Neurological: He is alert and oriented to person, place, and time.  Skin: Skin is warm and dry. No rash noted. He is not diaphoretic. No erythema. No pallor.  Psychiatric: He has a normal mood and affect. His behavior is normal. Judgment and thought content normal.      Ortho Exam Right and left upper extremity inspection reveals no abnormalities range of motion all joints all joints are reduced and stable muscle tone normal skin intact pulses good lymph nodes negative sensation normal   Right knee inspection normal full range of motion ligament stable strength normal skin normal pulses normal lymph nodes groin normal sensation normal no pathologic reflexes normal deep tendon reflexes he has normal coordination and balance   Left knee has a large swollen prepatellar bursa tender no erythema range of motion. Stability normal strength normal skin normal pulses  normal lymph nodes normal in the groin sensation normal no pathologic reflexes normal deep tendon reflexes   ASSESSMENT: My personal interpretation of the images:  Soft tissue swelling anterior to the patella no knee joint effusion. No fracture dislocation. Joint spaces are preserved and normal   4 views of the knee included AP oblique views and lateral   EXAM: LEFT KNEE - COMPLETE 4+ VIEW   COMPARISON:  None.   FINDINGS: Extensive prepatellar soft tissue swelling is present. The knee is located. There is no significant joint effusion. No acute osseous abnormality is present.   IMPRESSION: 1. Extensive prepatellar soft tissue swelling without acute or focal osseous abnormality.     Electronically Signed   By: Marin Robertshristopher  Mattern M.D.   On: 08/05/2015 18:58       PLAN Prepatellar bursectomy left  knee

## 2015-08-21 ENCOUNTER — Ambulatory Visit (HOSPITAL_COMMUNITY): Payer: 59 | Admitting: Anesthesiology

## 2015-08-21 ENCOUNTER — Ambulatory Visit (HOSPITAL_COMMUNITY)
Admission: RE | Admit: 2015-08-21 | Discharge: 2015-08-21 | Disposition: A | Discharge status: Home / Self Care | Payer: 59 | Source: Ambulatory Visit | Attending: Orthopedic Surgery | Admitting: Orthopedic Surgery

## 2015-08-21 ENCOUNTER — Encounter (HOSPITAL_COMMUNITY): Payer: Self-pay | Admitting: Emergency Medicine

## 2015-08-21 ENCOUNTER — Encounter (HOSPITAL_COMMUNITY)
Admission: RE | Disposition: A | Discharge status: Home / Self Care | Payer: Self-pay | Source: Ambulatory Visit | Attending: Orthopedic Surgery

## 2015-08-21 DIAGNOSIS — M7042 Prepatellar bursitis, left knee: Secondary | ICD-10-CM | POA: Diagnosis not present

## 2015-08-21 DIAGNOSIS — M7052 Other bursitis of knee, left knee: Secondary | ICD-10-CM | POA: Diagnosis not present

## 2015-08-21 DIAGNOSIS — M7050 Other bursitis of knee, unspecified knee: Secondary | ICD-10-CM

## 2015-08-21 DIAGNOSIS — F17210 Nicotine dependence, cigarettes, uncomplicated: Secondary | ICD-10-CM | POA: Insufficient documentation

## 2015-08-21 DIAGNOSIS — Z79899 Other long term (current) drug therapy: Secondary | ICD-10-CM | POA: Insufficient documentation

## 2015-08-21 DIAGNOSIS — G8929 Other chronic pain: Secondary | ICD-10-CM | POA: Insufficient documentation

## 2015-08-21 DIAGNOSIS — R51 Headache: Secondary | ICD-10-CM | POA: Insufficient documentation

## 2015-08-21 HISTORY — PX: KNEE BURSECTOMY: SHX5882

## 2015-08-21 SURGERY — BURSECTOMY, KNEE
Anesthesia: General | Site: Knee | Laterality: Left

## 2015-08-21 MED ORDER — MIDAZOLAM HCL 2 MG/2ML IJ SOLN
1.0000 mg | INTRAMUSCULAR | Status: DC | PRN
  Administered 2015-08-21: 1 mg via INTRAVENOUS

## 2015-08-21 MED ORDER — LIDOCAINE HCL (PF) 1 % IJ SOLN
INTRAMUSCULAR | Status: AC
  Filled 2015-08-21: qty 5

## 2015-08-21 MED ORDER — BUPIVACAINE-EPINEPHRINE (PF) 0.5% -1:200000 IJ SOLN
INTRAMUSCULAR | Status: AC
Start: 2015-08-21 — End: 2015-08-21
  Filled 2015-08-21: qty 30

## 2015-08-21 MED ORDER — ONDANSETRON HCL 4 MG/2ML IJ SOLN
INTRAMUSCULAR | Status: AC
  Filled 2015-08-21: qty 2

## 2015-08-21 MED ORDER — FENTANYL CITRATE (PF) 250 MCG/5ML IJ SOLN
INTRAMUSCULAR | Status: AC
Start: 2015-08-21 — End: 2015-08-21
  Filled 2015-08-21: qty 5

## 2015-08-21 MED ORDER — HYDROMORPHONE HCL 1 MG/ML IJ SOLN
0.2500 mg | INTRAMUSCULAR | Status: DC | PRN
  Administered 2015-08-21: 0.5 mg via INTRAVENOUS
  Filled 2015-08-21: qty 1

## 2015-08-21 MED ORDER — CEFAZOLIN SODIUM-DEXTROSE 2-4 GM/100ML-% IV SOLN
INTRAVENOUS | Status: AC
  Filled 2015-08-21: qty 100

## 2015-08-21 MED ORDER — ONDANSETRON HCL 4 MG/2ML IJ SOLN
4.0000 mg | Freq: Once | INTRAMUSCULAR | Status: AC
  Administered 2015-08-21: 4 mg via INTRAVENOUS

## 2015-08-21 MED ORDER — FENTANYL CITRATE (PF) 100 MCG/2ML IJ SOLN
25.0000 ug | INTRAMUSCULAR | Status: DC | PRN
  Administered 2015-08-21: 25 ug via INTRAVENOUS

## 2015-08-21 MED ORDER — LACTATED RINGERS IV SOLN
INTRAVENOUS | Status: DC
  Administered 2015-08-21: 08:00:00 via INTRAVENOUS

## 2015-08-21 MED ORDER — SODIUM CHLORIDE 0.9 % IJ SOLN
INTRAMUSCULAR | Status: AC
  Filled 2015-08-21: qty 10

## 2015-08-21 MED ORDER — FENTANYL CITRATE (PF) 100 MCG/2ML IJ SOLN
INTRAMUSCULAR | Status: DC | PRN
  Administered 2015-08-21 (×5): 50 ug via INTRAVENOUS

## 2015-08-21 MED ORDER — IBUPROFEN 800 MG PO TABS: 800.0000 mg | ORAL_TABLET | Freq: Three times a day (TID) | ORAL | 1 refills | Status: DC | PRN

## 2015-08-21 MED ORDER — PROPOFOL 10 MG/ML IV BOLUS
INTRAVENOUS | Status: DC | PRN
  Administered 2015-08-21: 150 mg via INTRAVENOUS

## 2015-08-21 MED ORDER — CHLORHEXIDINE GLUCONATE 4 % EX LIQD: 60.0000 mL | Freq: Once | CUTANEOUS | Status: DC

## 2015-08-21 MED ORDER — SODIUM CHLORIDE 0.9 % IR SOLN
Status: DC | PRN
  Administered 2015-08-21: 1000 mL

## 2015-08-21 MED ORDER — BUPIVACAINE-EPINEPHRINE 0.5% -1:200000 IJ SOLN
INTRAMUSCULAR | Status: DC | PRN
  Administered 2015-08-21: 30 mL

## 2015-08-21 MED ORDER — MIDAZOLAM HCL 2 MG/2ML IJ SOLN
INTRAMUSCULAR | Status: AC
  Filled 2015-08-21: qty 2

## 2015-08-21 MED ORDER — FENTANYL CITRATE (PF) 100 MCG/2ML IJ SOLN
INTRAMUSCULAR | Status: AC
  Filled 2015-08-21: qty 2

## 2015-08-21 MED ORDER — HYDROCODONE-ACETAMINOPHEN 5-325 MG PO TABS: 1.0000 | ORAL_TABLET | ORAL | 0 refills | Status: DC | PRN

## 2015-08-21 MED ORDER — LIDOCAINE HCL 1 % IJ SOLN
INTRAMUSCULAR | Status: DC | PRN
  Administered 2015-08-21: 25 mg via INTRADERMAL

## 2015-08-21 MED ORDER — CEFAZOLIN SODIUM-DEXTROSE 2-4 GM/100ML-% IV SOLN
2.0000 g | INTRAVENOUS | Status: AC
  Administered 2015-08-21: 2 g via INTRAVENOUS

## 2015-08-21 MED ORDER — MIDAZOLAM HCL 5 MG/5ML IJ SOLN
INTRAMUSCULAR | Status: DC | PRN
  Administered 2015-08-21: 2 mg via INTRAVENOUS

## 2015-08-21 MED ORDER — EPHEDRINE SULFATE 50 MG/ML IJ SOLN
INTRAMUSCULAR | Status: AC
  Filled 2015-08-21: qty 1

## 2015-08-21 SURGICAL SUPPLY — 63 items
ATTRACTOMAT 16X20 MAGNETIC DRP (DRAPES) IMPLANT
BAG HAMPER (MISCELLANEOUS) ×2 IMPLANT
BANDAGE ELASTIC 4 VELCRO NS (GAUZE/BANDAGES/DRESSINGS) ×1 IMPLANT
BANDAGE ELASTIC 6 LF NS (GAUZE/BANDAGES/DRESSINGS) ×1 IMPLANT
BANDAGE ELASTIC 6 VELCRO NS (GAUZE/BANDAGES/DRESSINGS) ×1 IMPLANT
BANDAGE ESMARK 4X12 BL STRL LF (DISPOSABLE) ×1 IMPLANT
BLADE SURG 15 STRL LF DISP TIS (BLADE) ×1 IMPLANT
BLADE SURG 15 STRL SS (BLADE) ×2
BNDG CMPR 12X4 ELC STRL LF (DISPOSABLE) ×1
BNDG CMPR MED 5X6 ELC HKLP NS (GAUZE/BANDAGES/DRESSINGS) ×1
BNDG COHESIVE 4X5 TAN STRL (GAUZE/BANDAGES/DRESSINGS) ×2 IMPLANT
BNDG ESMARK 4X12 BLUE STRL LF (DISPOSABLE) ×2
CHLORAPREP W/TINT 26ML (MISCELLANEOUS) ×2 IMPLANT
CLOTH BEACON ORANGE TIMEOUT ST (SAFETY) ×2 IMPLANT
COVER LIGHT HANDLE STERIS (MISCELLANEOUS) ×4 IMPLANT
COVER MAYO STAND XLG (DRAPE) ×2 IMPLANT
DECANTER SPIKE VIAL GLASS SM (MISCELLANEOUS) ×2 IMPLANT
DRAPE INCISE IOBAN 44X35 STRL (DRAPES) IMPLANT
FORMALIN 10 PREFIL 120ML (MISCELLANEOUS) ×2 IMPLANT
GAUZE PACKING IODOFORM 1 (PACKING) IMPLANT
GAUZE SPONGE 4X4 12PLY STRL (GAUZE/BANDAGES/DRESSINGS) ×2 IMPLANT
GAUZE XEROFORM 5X9 LF (GAUZE/BANDAGES/DRESSINGS) ×2 IMPLANT
GLOVE BIOGEL PI IND STRL 7.0 (GLOVE) ×1 IMPLANT
GLOVE BIOGEL PI INDICATOR 7.0 (GLOVE) ×1
GLOVE SKINSENSE NS SZ8.0 LF (GLOVE) ×1
GLOVE SKINSENSE STRL SZ8.0 LF (GLOVE) ×1 IMPLANT
GLOVE SS N UNI LF 8.5 STRL (GLOVE) ×2 IMPLANT
GOWN STRL REUS W/TWL LRG LVL3 (GOWN DISPOSABLE) ×4 IMPLANT
GOWN STRL REUS W/TWL XL LVL3 (GOWN DISPOSABLE) ×2 IMPLANT
HANDPIECE INTERPULSE COAX TIP (DISPOSABLE)
INST SET MINOR BONE (KITS) ×2 IMPLANT
IV NS IRRIG 3000ML ARTHROMATIC (IV SOLUTION) ×2 IMPLANT
KIT ROOM TURNOVER APOR (KITS) ×2 IMPLANT
MANIFOLD NEPTUNE II (INSTRUMENTS) ×2 IMPLANT
MARKER SKIN DUAL TIP RULER LAB (MISCELLANEOUS) ×2 IMPLANT
NEEDLE HYPO 18GX1.5 BLUNT FILL (NEEDLE) IMPLANT
NEEDLE HYPO 21X1.5 SAFETY (NEEDLE) ×2 IMPLANT
NS IRRIG 1000ML POUR BTL (IV SOLUTION) ×2 IMPLANT
PACK BASIC LIMB (CUSTOM PROCEDURE TRAY) ×2 IMPLANT
PAD ABD 5X9 TENDERSORB (GAUZE/BANDAGES/DRESSINGS) ×2 IMPLANT
PAD ARMBOARD 7.5X6 YLW CONV (MISCELLANEOUS) ×2 IMPLANT
PAD CAST 4YDX4 CTTN HI CHSV (CAST SUPPLIES) ×1 IMPLANT
PADDING CAST COTTON 4X4 STRL (CAST SUPPLIES) ×2
PADDING WEBRIL 4 STERILE (GAUZE/BANDAGES/DRESSINGS) ×1 IMPLANT
PADDING WEBRIL 6 STERILE (GAUZE/BANDAGES/DRESSINGS) ×1 IMPLANT
SET BASIN LINEN APH (SET/KITS/TRAYS/PACK) ×2 IMPLANT
SET HNDPC FAN SPRY TIP SCT (DISPOSABLE) IMPLANT
SPONGE LAP 18X18 X RAY DECT (DISPOSABLE) ×2 IMPLANT
STAPLER VISISTAT 35W (STAPLE) ×1 IMPLANT
STRIP CLOSURE SKIN 1/2X4 (GAUZE/BANDAGES/DRESSINGS) IMPLANT
SUCTION YANKAUER HANDLE (MISCELLANEOUS) ×2 IMPLANT
SUT BRALON NAB BRD #1 30IN (SUTURE) ×2 IMPLANT
SUT MON AB 0 CT1 (SUTURE) ×2 IMPLANT
SUT MON AB 2-0 CT1 36 (SUTURE) ×2 IMPLANT
SUT MON AB 2-0 SH 27 (SUTURE)
SUT MON AB 2-0 SH27 (SUTURE) IMPLANT
SUT MON AB 3-0 SH 27 (SUTURE) IMPLANT
SUT VIC AB 1 CT1 27 (SUTURE)
SUT VIC AB 1 CT1 27XBRD ANTBC (SUTURE) ×1 IMPLANT
SWAB CULTURE LIQ STUART DBL (MISCELLANEOUS) ×1 IMPLANT
SYR BULB IRRIGATION 50ML (SYRINGE) ×4 IMPLANT
SYRINGE 10CC LL (SYRINGE) ×2 IMPLANT
TUBE ANAEROBIC PORT A CUL  W/M (MISCELLANEOUS) ×2 IMPLANT

## 2015-08-21 NOTE — Anesthesia Procedure Notes (Signed)
Procedure Name: LMA Insertion Date/Time: 08/21/2015 8:40 AM Performed by: Despina HiddenIDACAVAGE, [name] J Pre-anesthesia Checklist: Patient identified, Patient being monitored, Emergency Drugs available, Timeout performed and Suction available Patient Re-evaluated:Patient Re-evaluated prior to inductionOxygen Delivery Method: Circle System Utilized Preoxygenation: Pre-oxygenation with 100% oxygen Intubation Type: IV induction Ventilation: Mask ventilation without difficulty LMA: LMA inserted LMA Size: 4.0 Number of attempts: 1 Placement Confirmation: positive ETCO2 and breath sounds checked- equal and bilateral Tube secured with: Tape Dental Injury: Teeth and Oropharynx as per pre-operative assessment

## 2015-08-21 NOTE — Transfer of Care (Signed)
Immediate Anesthesia Transfer of Care Note  Patient: Verl BangsDerek L Schicker  Procedure(s) Performed: Procedure(s) with comments: KNEE BURSECTOMY (Left) - PREPATELLAR  Patient Location: PACU  Anesthesia Type:General  Level of Consciousness: awake, oriented and patient cooperative  Airway & Oxygen Therapy: Patient Spontanous Breathing and Patient connected to face mask oxygen  Post-op Assessment: Report given to RN, Post -op Vital signs reviewed and stable and Patient moving all extremities  Post vital signs: Reviewed and stable  Last Vitals:  Vitals:   08/21/15 0824 08/21/15 0834  BP: 117/80 122/79  Pulse:    Resp: 10 11  Temp:      Last Pain:  Vitals:   08/21/15 0756  TempSrc: Oral  PainSc: 5          Complications: No apparent anesthesia complications

## 2015-08-21 NOTE — Anesthesia Preprocedure Evaluation (Signed)
Anesthesia Evaluation  Patient identified by MRN, date of birth, ID band Patient awake    Reviewed: Allergy & Precautions, H&P , NPO status , Patient's Chart, lab work & pertinent test results  Airway Mallampati: II  TM Distance: >3 FB Neck ROM: Full    Dental no notable dental hx. (+) Teeth Intact, Dental Advisory Given   Pulmonary Current Smoker,    Pulmonary exam normal breath sounds clear to auscultation       Cardiovascular negative cardio ROS   Rhythm:Regular Rate:Normal     Neuro/Psych  Headaches, negative psych ROS   GI/Hepatic negative GI ROS, Neg liver ROS,   Endo/Other  negative endocrine ROS  Renal/GU negative Renal ROS  negative genitourinary   Musculoskeletal   Abdominal   Peds  Hematology negative hematology ROS (+)   Anesthesia Other Findings   Reproductive/Obstetrics negative OB ROS                             Anesthesia Physical Anesthesia Plan  ASA: II  Anesthesia Plan: General   Post-op Pain Management:    Induction: Intravenous  Airway Management Planned: LMA  Additional Equipment:   Intra-op Plan:   Post-operative Plan: Extubation in OR  Informed Consent: I have reviewed the patients History and Physical, chart, labs and discussed the procedure including the risks, benefits and alternatives for the proposed anesthesia with the patient or authorized representative who has indicated his/her understanding and acceptance.     Plan Discussed with:   Anesthesia Plan Comments:         Anesthesia Quick Evaluation

## 2015-08-21 NOTE — Brief Op Note (Signed)
08/21/2015  9:35 AM  PATIENT:  Daniel Hays  35 y.o. male  PRE-OPERATIVE DIAGNOSIS:  LEFT KNEE PREPATELLAR BURSA  POST-OPERATIVE DIAGNOSIS:  LEFT KNEE PREPATELLAR BURSA  Surgical findings: Prepatellar bursal sac anterior to the patella under skin.  PROCEDURE:  Procedure(s) with comments: KNEE BURSECTOMY (Left) - PREPATELLAR  SURGEON:  Surgeon(s) and Role:    * Salimatou Simone E Betzalel Umbarger, MD - Primary  PHYSICIAN ASSISTANT:   Assistants none  Anesthesia Gen.  Blood loss none Drains none  30 mL of Marcaine with epinephrine  Specimen prepatellar bursal sac left knee  Specimen to be sent to pathology  Counts were correct  Tourniquet time was 26 minutes  Dictation will be Dragon    EBL:  Total I/O In: 600 [I.V.:600] Out: 0    TOURNIQUET:  * Missing tourniquet times found for documented tourniquets in log:  [id number] *    PLAN OF CARE: Discharge to home after PACU  PATIENT DISPOSITION:  PACU - hemodynamically stable.   Delay start of Pharmacological VTE agent (>24hrs) due to surgical blood loss or risk of bleeding: not applicable  Surgery was done as follows  Indication for procedure was recurrent prepatellar bursitis left knee.  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 He had multiple aspirations without resolution with continued pain when kneeling. He opted for surgical treatment versus continued nonoperative treatment. Appropriate informed consent was obtained. He understands the risk of infection and possible recurrence.  The patient was a properly identified in the preop area and the surgical site was confirmed and marked. Appropriate chart review was performed and appropriate paperwork was signed.  The patient was taken to the operating room for general anesthesia followed by administering 2 g of Ancef IV.  The left leg was prepped from groin to ankle and draped sterilely.  The surgical team took a timeout to  confirm the procedure and all were in agreement  The limb was exsanguinated with a 4 inch Esmarch bandage followed by inflation of the tourniquet to 300 mmHg  We made a midline incision. We started above the patella and found the plane of the bursal sac and with sharp dissection dissected away from the skin and from the underlying patella. We then obtained hemostasis with electrocautery.  We irrigated the wound with copious amounts of saline and then closed with 0 Monocryl and 2-0 Monocryl and staples injecting 30 mL of Marcaine with epinephrine in the wound  A sterile bandage was applied the tourniquet was released  The patient was then extubated and taken to recovery room in stable condition  The postoperative plan is for him to be weightbearing as tolerated with a dressing change in 2 days and return to work in 14 days  

## 2015-08-21 NOTE — Op Note (Signed)
08/21/2015  9:35 AM  PATIENT:  Daniel Hays  35 y.o. male  PRE-OPERATIVE DIAGNOSIS:  LEFT KNEE PREPATELLAR BURSA  POST-OPERATIVE DIAGNOSIS:  LEFT KNEE PREPATELLAR BURSA  Surgical findings: Prepatellar bursal sac anterior to the patella under skin.  PROCEDURE:  Procedure(s) with comments: KNEE BURSECTOMY (Left) - PREPATELLAR  SURGEON:  Surgeon(s) and Role:    * Vickki HearingStanley E Ramil Edgington, MD - Primary  PHYSICIAN ASSISTANT:   Assistants none  Anesthesia Gen.  Blood loss none Drains none  30 mL of Marcaine with epinephrine  Specimen prepatellar bursal sac left knee  Specimen to be sent to pathology  Counts were correct  Tourniquet time was 26 minutes  Dictation will be Dragon    EBL:  Total I/O In: 600 [I.V.:600] Out: 0    TOURNIQUET:  * Missing tourniquet times found for documented tourniquets in log:  [id number] *    PLAN OF CARE: Discharge to home after PACU  PATIENT DISPOSITION:  PACU - hemodynamically stable.   Delay start of Pharmacological VTE agent (>24hrs) due to surgical blood loss or risk of bleeding: not applicable  Surgery was done as follows  Indication for procedure was recurrent prepatellar bursitis left knee.  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 He had multiple aspirations without resolution with continued pain when kneeling. He opted for surgical treatment versus continued nonoperative treatment. Appropriate informed consent was obtained. He understands the risk of infection and possible recurrence.  The patient was a properly identified in the preop area and the surgical site was confirmed and marked. Appropriate chart review was performed and appropriate paperwork was signed.  The patient was taken to the operating room for general anesthesia followed by administering 2 g of Ancef IV.  The left leg was prepped from groin to ankle and draped sterilely.  The surgical team took a timeout to  confirm the procedure and all were in agreement  The limb was exsanguinated with a 4 inch Esmarch bandage followed by inflation of the tourniquet to 300 mmHg  We made a midline incision. We started above the patella and found the plane of the bursal sac and with sharp dissection dissected away from the skin and from the underlying patella. We then obtained hemostasis with electrocautery.  We irrigated the wound with copious amounts of saline and then closed with 0 Monocryl and 2-0 Monocryl and staples injecting 30 mL of Marcaine with epinephrine in the wound  A sterile bandage was applied the tourniquet was released  The patient was then extubated and taken to recovery room in stable condition  The postoperative plan is for him to be weightbearing as tolerated with a dressing change in 2 days and return to work in 14 days

## 2015-08-21 NOTE — Anesthesia Postprocedure Evaluation (Signed)
Anesthesia Post Note  Patient: Daniel BangsDerek L Hays  Procedure(s) Performed: Procedure(s) (LRB): KNEE BURSECTOMY (Left)  Patient location during evaluation: PACU Anesthesia Type: General Level of consciousness: awake and alert, oriented and patient cooperative Pain management: pain level controlled Vital Signs Assessment: post-procedure vital signs reviewed and stable Respiratory status: spontaneous breathing, nonlabored ventilation and respiratory function stable Cardiovascular status: blood pressure returned to baseline and stable Postop Assessment: no signs of nausea or vomiting Anesthetic complications: no    Last Vitals:  Vitals:   08/21/15 0824 08/21/15 0834  BP: 117/80 122/79  Pulse:    Resp: 10 11  Temp:      Last Pain:  Vitals:   08/21/15 0756  TempSrc: Oral  PainSc: 5                  Torsten Weniger J

## 2015-08-21 NOTE — Interval H&P Note (Signed)
History and Physical Interval Note:  08/21/2015 8:14 AM BP 110/77   Pulse (!) 51   Temp 98.3 F (36.8 C) (Oral)   Resp 16   Ht 6\' 1"  (1.854 m)   Wt 226 lb (102.5 kg)   SpO2 96%   BMI 29.82 kg/m  Skin clean at surgical site  Daniel Hays  has presented today for surgery, with the diagnosis of LEFT KNEE PREPATELLAR BURSA  The various methods of treatment have been discussed with the patient and family. After consideration of risks, benefits and other options for treatment, the patient has consented to  Procedure(s) with comments: KNEE BURSECTOMY (Left) - PREPATELLAR as a surgical intervention .  The patient's history has been reviewed, patient examined, no change in status, stable for surgery.  I have reviewed the patient's chart and labs.  Questions were answered to the patient's satisfaction.     Fuller CanadaStanley Tamico Mundo

## 2015-08-21 NOTE — Discharge Instructions (Signed)
Light activity over the weekend  Apply ice packs to the knee for 20 minutes every 2 hours while you're awake  Remove the dressing in 48 hours and apply 4 x 4 gauze with tape  Do not shower until you see the doctor

## 2015-08-25 ENCOUNTER — Encounter: Payer: Self-pay | Admitting: Orthopedic Surgery

## 2015-08-25 ENCOUNTER — Telehealth: Payer: Self-pay | Admitting: Orthopedic Surgery

## 2015-08-25 ENCOUNTER — Ambulatory Visit (INDEPENDENT_AMBULATORY_CARE_PROVIDER_SITE_OTHER): Payer: 59 | Admitting: Orthopedic Surgery

## 2015-08-25 VITALS — BP 145/95 | HR 108 | Ht 73.0 in | Wt 220.0 lb

## 2015-08-25 DIAGNOSIS — Z4789 Encounter for other orthopedic aftercare: Secondary | ICD-10-CM

## 2015-08-25 MED ORDER — HYDROCODONE-ACETAMINOPHEN 10-325 MG PO TABS: 1.0000 | ORAL_TABLET | Freq: Four times a day (QID) | ORAL | 0 refills | Status: DC | PRN

## 2015-08-25 NOTE — Telephone Encounter (Signed)
Advised patient to keep covered with Tegaderm and okay to shower but try to avoid direct contact on wound to keep try, if Tegaderm start to come off before appt we can give him another.

## 2015-08-25 NOTE — Telephone Encounter (Signed)
Patient called asking if he could take a full shower now and how long does he need to keep the Tegaderm bandage on? He can be reached at this number: [phone].  Please call and advise

## 2015-08-25 NOTE — Progress Notes (Signed)
Postop visit #1 status post bursectomy left knee  Patient planes of pain on 5 mg Norco one 800 ibuprofen with throbbing when he gets back up  Wound looks clean minimal swelling he is very tender around the knee joint  A new dressing was applied with Tegaderm  Changes pain medicine than Norco 10 mg every 6 #40 with a follow-up for staples to come out on September 1 postop day 13-14

## 2015-08-26 ENCOUNTER — Encounter (HOSPITAL_COMMUNITY): Payer: Self-pay | Admitting: Orthopedic Surgery

## 2015-08-27 ENCOUNTER — Ambulatory Visit (INDEPENDENT_AMBULATORY_CARE_PROVIDER_SITE_OTHER): Payer: 59 | Admitting: Orthopedic Surgery

## 2015-08-27 ENCOUNTER — Encounter: Payer: Self-pay | Admitting: Orthopedic Surgery

## 2015-08-27 VITALS — BP 131/85 | HR 70 | Ht 73.0 in | Wt 220.0 lb

## 2015-08-27 DIAGNOSIS — Z4789 Encounter for other orthopedic aftercare: Secondary | ICD-10-CM

## 2015-08-27 DIAGNOSIS — M7052 Other bursitis of knee, left knee: Secondary | ICD-10-CM

## 2015-08-27 NOTE — Patient Instructions (Signed)
Continue current treatment. 

## 2015-08-27 NOTE — Progress Notes (Signed)
35 year old male had a bursectomy a week or so ago comes in complaining of redness and throbbing pain  He said the redness is now gone away he still continuing with throbbing pain when he puts his leg and deepened the position but the wound looks fine I did remove some of the staples to give the skin some room to breathe but everything looks fine his calf is supple there are no signs of infection  Keep appointment for staples to be coming out

## 2015-08-28 ENCOUNTER — Ambulatory Visit: Payer: 59 | Admitting: Orthopedic Surgery

## 2015-09-04 ENCOUNTER — Encounter: Payer: Self-pay | Admitting: Orthopedic Surgery

## 2015-09-04 ENCOUNTER — Ambulatory Visit (INDEPENDENT_AMBULATORY_CARE_PROVIDER_SITE_OTHER): Payer: 59 | Admitting: Orthopedic Surgery

## 2015-09-04 VITALS — BP 124/80 | HR 71 | Ht 73.0 in | Wt 225.0 lb

## 2015-09-04 DIAGNOSIS — M7052 Other bursitis of knee, left knee: Secondary | ICD-10-CM

## 2015-09-04 DIAGNOSIS — Z4789 Encounter for other orthopedic aftercare: Secondary | ICD-10-CM

## 2015-09-04 NOTE — Progress Notes (Signed)
Patient ID: Daniel Hays, male   DOB: February 13, 1980, 35 y.o.   MRN: [id number]  Post op visit   Status post bursectomy left knee on August 18  Comes in for suture staple removal  Wound looks clean  Patient's pain has decreased significantly his knee flexion is a little sluggish  There are no signs of infection all staples were removed  Chief Complaint  Patient presents with  . Follow-up    Post op #2, left knee. DOS 08-21-15.    BP 124/80   Pulse 71   Ht 6\' 1"  (1.854 m)   Wt 225 lb (102.1 kg)   BMI 29.69 kg/m   ASSESSMENT AND PLAN   Resume normal activity  work on knee flexion, follow-up as needed  He will call us to get his work note

## 2015-09-04 NOTE — Patient Instructions (Addendum)
Work on knee flexion and the knee 100 times a day until flexion returns to normal  Transition to Advil and Tylenol for pain  Call when you are ready to return to work and we will provide work note

## 2016-07-21 ENCOUNTER — Encounter (HOSPITAL_COMMUNITY): Payer: Self-pay | Admitting: Emergency Medicine

## 2016-07-21 ENCOUNTER — Emergency Department (HOSPITAL_COMMUNITY)
Admission: EM | Admit: 2016-07-21 | Discharge: 2016-07-21 | Disposition: A | Discharge status: Home / Self Care | Payer: 59 | Attending: Emergency Medicine | Admitting: Emergency Medicine

## 2016-07-21 DIAGNOSIS — F17210 Nicotine dependence, cigarettes, uncomplicated: Secondary | ICD-10-CM | POA: Diagnosis not present

## 2016-07-21 DIAGNOSIS — T402X1A Poisoning by other opioids, accidental (unintentional), initial encounter: Secondary | ICD-10-CM | POA: Insufficient documentation

## 2016-07-21 DIAGNOSIS — T40601A Poisoning by unspecified narcotics, accidental (unintentional), initial encounter: Secondary | ICD-10-CM

## 2016-07-21 MED ORDER — DICYCLOMINE HCL 20 MG PO TABS: 20.0000 mg | ORAL_TABLET | Freq: Two times a day (BID) | ORAL | 0 refills | Status: DC

## 2016-07-21 MED ORDER — NALOXONE HCL 4 MG/0.1ML NA LIQD: NASAL | 1 refills | Status: DC

## 2016-07-21 MED ORDER — IBUPROFEN 800 MG PO TABS: 800.0000 mg | ORAL_TABLET | Freq: Three times a day (TID) | ORAL | 0 refills | Status: DC | PRN

## 2016-07-21 MED ORDER — METHOCARBAMOL 500 MG PO TABS: 500.0000 mg | ORAL_TABLET | Freq: Two times a day (BID) | ORAL | 0 refills | Status: DC

## 2016-07-21 MED ORDER — ONDANSETRON 4 MG PO TBDP: 4.0000 mg | ORAL_TABLET | Freq: Three times a day (TID) | ORAL | 0 refills | Status: DC | PRN

## 2016-07-21 NOTE — Discharge Instructions (Signed)
Substance Abuse Treatment Programs ° °Intensive Outpatient Programs °[facility]     °[address]      °[place], [place]                   °[phone]      ° °[facility] °[address] °[place], [place] °[phone] ° °[facility]     °(Inpatient and outpatient)     °[address]           °[phone]   ° °[facility] °[phone] (Suboxone and Methadone) ° °[address]      °[place], [place] [address]      °[phone]      ° °[address] °[place], [place] °852-3033 ° °[facility] (Outpatient/Inpatient, [facility])    °(insurance only) [phone]      °       °[facility] (Groups & Residential) °[place], [place] °[phone] ° °   °[facility]     °[address]     °[place], [place]      °[phone]      ° °[facility] (for caregivers and family) °[address] °[place], [place] °336-299-4655 ° ° ° ° ° °Residential Treatment Programs °[facility]      °[address], [place], [place] [address]  °([phone]      ° °[facility] °[address] [place], [place] [address] °[phone] ° °[facility]        °336-248-8914      ° °[facility] °[phone] ° °[facility]             °[address]                                         °[place], [place]                                                °877-615-2722 or 336-784-9470                              ° °[facility] [place] °[address] °[place], [address] °[phone] ° °[facility]    °[address]      °[place], [place]     °[phone]      ° °[facility] °[address] °, [place] °336-285-9073 ° °[facility]   °[address]     °[place], [place] [address]     °[phone]      °Admissions: 8am-3pm M-F ° °[facility] °[address] °[place] °336-227-7417 ° °[facility]: Residential Program (90 Days)   °[place], [place]      °336-725-8389 or [phone]    ° °[facility] °[place], [place] °(Walk in Hours over the weekend or by referral) ° °[facility] °[address], [place], [place] [address] °([phone] ° °Crisis Mobile: [facility]:  1-877-626-1772 (for crisis response 24 hours a day) °[facility] Hotline:      1-800-256-2452 °Outpatient Psychiatry and Counseling ° °[facility]: Mobile Crisis   Management 24 hours:  1-877-626-1772 ° °[facility] sliding scale fee and walk in schedule: M-F 8am-12pm/1pm-3pm °[address]  °[place], [place] [address] °[phone] ° °[facility] °[address] °[place], [place] [address] °336-703-9650 ° °[facility] ([facility] new patient walk-in appointments available Monday - Friday 8am -3pm.          °[address] °[place], [place] [address] °336-676-6840 or crisis line- 336-676-6905 ° °[facility] Intensive Outpatient Therapy Program °[address] °[place], [place] [address] °336-832-9804 ° °[facility]                  °Crisis Services      °[phone]      °[address]     °[place], [place] [address]                ° °[facility]   °[facility] °[phone] °[address] °[place], [place] [address] ° ° °[facility]          °[address],  °[place], [place] [address]       °([phone] ° °[facility] °[address] °[place], [place] [address] °336-292-1510 ° °[facility]    °[address]    °[place], [place] [address]     °[phone]      ° °Parish McKinney, MD     °[address]     °[place] [address]     °336-282-1251     °  °[facility] °[address] °[place] [address] ° °[facility]     °[address]     °[place], [place]      °336-542-2076      ° °[facility] °Shamsher Ahluwalia, MD °[address] °[place], [place] [address] °[phone] ° °[facility]     °[address]     °[place], [place] [address]     °336-274-1237      ° °[facility] °[address] °[place], [place] [address] °336-854-4450 °Resources for Temporary Residential Assistance/Crisis Centers ° °DAY CENTERS °[facility] °M-F 8am-3pm   °[address] [place], [place] [address]   336-332-0824 °Services include: laundry, barbering, support groups, case management, phone  & computer access, showers, AA/NA mtgs, mental health/substance abuse nurse, job skills class, disability information, VA assistance, spiritual classes, etc.  ° °HOMELESS SHELTERS ° °[facility]     °[facility]   °[address], [place]     °[phone]       °       °[facility] (women and children)       °[address] °[place], [place] [address] °336-275-0820 °[email or web address] for application and process °Application Required ° °[facility]   °[address]    °[place] [address]     °[phone]       °             °[facility] °[address] °[place], [place] [address] °336.273.5572 °336-235-0363(schedule application appt.) °Application Required ° °[facility] (women only)    °[address]     °[place], [place] [address]     °336-884-1039      °  Intake starts 6pm daily °Need valid ID, SSC, & Police report °[facility] [place] °[address] °[place], [place] °336-881-5420 °Application Required ° °[facility] (men only)     °[address]      °[place], [place]     °[phone]      ° °[facility] °(Pregnant women only) °[address] °[place], [place] °336-275-0206 ° °[facility]      °[address]      °[place], [place] [address]     °[phone]      °       °[facility] °[address] °[place], [place] °336-723-1848 °90 day commitment/SA/Application process ° °[facility] only)     °[address]     °[place], [place]     °[phone]       °Check-in at 7pm     °       °[facility] °[address] °[place],  [address] °336-248-6684 °Men/Women/Women and Children must be there by 7 pm ° °[facility] °[place],  °336-722-8721                ° °
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
## 2016-07-21 NOTE — ED Notes (Signed)
ED Provider at bedside. 

## 2016-07-21 NOTE — ED Notes (Signed)
Pt denies any SI/HI/AVH. Denies this being a suicide attempt.

## 2016-07-21 NOTE — ED Triage Notes (Signed)
Pt states he was at a friend's house and snorted 2 Oxycodone 30 mg tablets.  Became unresponsive with agonal respirations.  Given Narcan 4mg  nasally by ems with resolution.  Pt alert and oriented currently.
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
## 2017-01-18 ENCOUNTER — Ambulatory Visit (HOSPITAL_COMMUNITY)
Admission: RE | Admit: 2017-01-18 | Discharge: 2017-01-18 | Disposition: A | Discharge status: Home / Self Care | Payer: BLUE CROSS/BLUE SHIELD | Source: Ambulatory Visit | Attending: Pulmonary Disease | Admitting: Pulmonary Disease

## 2017-01-18 ENCOUNTER — Other Ambulatory Visit (HOSPITAL_COMMUNITY): Payer: Self-pay | Admitting: Pulmonary Disease

## 2017-01-18 DIAGNOSIS — R05 Cough: Secondary | ICD-10-CM

## 2017-01-18 DIAGNOSIS — R059 Cough, unspecified: Secondary | ICD-10-CM

## 2017-01-18 DIAGNOSIS — K210 Gastro-esophageal reflux disease with esophagitis: Secondary | ICD-10-CM | POA: Diagnosis not present

## 2017-01-18 DIAGNOSIS — J411 Mucopurulent chronic bronchitis: Secondary | ICD-10-CM | POA: Diagnosis not present

## 2017-01-18 DIAGNOSIS — R112 Nausea with vomiting, unspecified: Secondary | ICD-10-CM | POA: Diagnosis not present

## 2017-01-18 DIAGNOSIS — F17200 Nicotine dependence, unspecified, uncomplicated: Secondary | ICD-10-CM | POA: Diagnosis not present

## 2017-05-12 ENCOUNTER — Encounter (HOSPITAL_COMMUNITY): Payer: Self-pay

## 2017-05-12 ENCOUNTER — Other Ambulatory Visit (HOSPITAL_COMMUNITY): Payer: Self-pay | Admitting: Pulmonary Disease

## 2017-05-12 ENCOUNTER — Ambulatory Visit (HOSPITAL_COMMUNITY)
Admission: RE | Admit: 2017-05-12 | Discharge: 2017-05-12 | Disposition: A | Discharge status: Home / Self Care | Payer: BLUE CROSS/BLUE SHIELD | Source: Ambulatory Visit | Attending: Pulmonary Disease | Admitting: Pulmonary Disease

## 2017-05-12 DIAGNOSIS — R634 Abnormal weight loss: Secondary | ICD-10-CM | POA: Diagnosis not present

## 2017-05-12 DIAGNOSIS — J411 Mucopurulent chronic bronchitis: Secondary | ICD-10-CM | POA: Diagnosis not present

## 2017-05-12 DIAGNOSIS — F419 Anxiety disorder, unspecified: Secondary | ICD-10-CM | POA: Diagnosis not present

## 2017-05-12 DIAGNOSIS — R109 Unspecified abdominal pain: Secondary | ICD-10-CM

## 2017-05-12 DIAGNOSIS — F17200 Nicotine dependence, unspecified, uncomplicated: Secondary | ICD-10-CM | POA: Diagnosis not present

## 2017-05-12 DIAGNOSIS — R112 Nausea with vomiting, unspecified: Secondary | ICD-10-CM

## 2017-05-12 MED ORDER — IOPAMIDOL (ISOVUE-300) INJECTION 61%: 100.0000 mL | Freq: Once | INTRAVENOUS | Status: DC | PRN

## 2017-05-15 ENCOUNTER — Other Ambulatory Visit (HOSPITAL_COMMUNITY): Payer: Self-pay | Admitting: Pulmonary Disease

## 2017-05-15 DIAGNOSIS — E0590 Thyrotoxicosis, unspecified without thyrotoxic crisis or storm: Secondary | ICD-10-CM

## 2017-05-17 ENCOUNTER — Encounter (HOSPITAL_COMMUNITY): Payer: Self-pay

## 2017-05-17 ENCOUNTER — Ambulatory Visit (HOSPITAL_COMMUNITY): Payer: BLUE CROSS/BLUE SHIELD

## 2017-05-17 ENCOUNTER — Ambulatory Visit (HOSPITAL_COMMUNITY)
Admission: RE | Admit: 2017-05-17 | Discharge: 2017-05-17 | Disposition: A | Discharge status: Home / Self Care | Payer: BLUE CROSS/BLUE SHIELD | Source: Ambulatory Visit | Attending: Pulmonary Disease | Admitting: Pulmonary Disease

## 2017-05-19 ENCOUNTER — Other Ambulatory Visit (HOSPITAL_COMMUNITY): Payer: BLUE CROSS/BLUE SHIELD

## 2017-05-31 ENCOUNTER — Ambulatory Visit (HOSPITAL_COMMUNITY)
Admission: RE | Admit: 2017-05-31 | Discharge: 2017-05-31 | Disposition: A | Discharge status: Home / Self Care | Payer: BLUE CROSS/BLUE SHIELD | Source: Ambulatory Visit | Attending: Pulmonary Disease | Admitting: Pulmonary Disease

## 2017-05-31 DIAGNOSIS — Z9889 Other specified postprocedural states: Secondary | ICD-10-CM | POA: Diagnosis not present

## 2017-05-31 DIAGNOSIS — E0590 Thyrotoxicosis, unspecified without thyrotoxic crisis or storm: Secondary | ICD-10-CM | POA: Diagnosis not present

## 2017-05-31 DIAGNOSIS — R110 Nausea: Secondary | ICD-10-CM | POA: Diagnosis not present

## 2017-05-31 MED ORDER — IOPAMIDOL (ISOVUE-300) INJECTION 61%
100.0000 mL | Freq: Once | INTRAVENOUS | Status: AC | PRN
  Administered 2017-05-31: 100 mL via INTRAVENOUS

## 2017-06-01 ENCOUNTER — Other Ambulatory Visit (HOSPITAL_COMMUNITY): Payer: BLUE CROSS/BLUE SHIELD

## 2017-06-09 ENCOUNTER — Ambulatory Visit (HOSPITAL_COMMUNITY): Payer: BLUE CROSS/BLUE SHIELD

## 2017-07-20 DIAGNOSIS — H1045 Other chronic allergic conjunctivitis: Secondary | ICD-10-CM | POA: Diagnosis not present

## 2018-03-13 DIAGNOSIS — M76822 Posterior tibial tendinitis, left leg: Secondary | ICD-10-CM | POA: Diagnosis not present

## 2018-03-13 DIAGNOSIS — M79672 Pain in left foot: Secondary | ICD-10-CM | POA: Diagnosis not present

## 2018-03-13 DIAGNOSIS — M205X2 Other deformities of toe(s) (acquired), left foot: Secondary | ICD-10-CM | POA: Diagnosis not present

## 2018-03-13 DIAGNOSIS — M7752 Other enthesopathy of left foot: Secondary | ICD-10-CM | POA: Diagnosis not present

## 2018-06-05 ENCOUNTER — Other Ambulatory Visit: Payer: Self-pay

## 2018-06-05 ENCOUNTER — Ambulatory Visit (HOSPITAL_COMMUNITY)
Admission: RE | Admit: 2018-06-05 | Discharge: 2018-06-05 | Disposition: A | Discharge status: Home / Self Care | Payer: BC Managed Care – PPO | Source: Ambulatory Visit | Attending: Pulmonary Disease | Admitting: Pulmonary Disease

## 2018-06-05 ENCOUNTER — Other Ambulatory Visit (HOSPITAL_COMMUNITY): Payer: Self-pay | Admitting: Pulmonary Disease

## 2018-06-05 DIAGNOSIS — R109 Unspecified abdominal pain: Secondary | ICD-10-CM | POA: Diagnosis not present

## 2018-06-05 DIAGNOSIS — M25561 Pain in right knee: Secondary | ICD-10-CM

## 2018-06-05 DIAGNOSIS — L988 Other specified disorders of the skin and subcutaneous tissue: Secondary | ICD-10-CM

## 2018-06-05 DIAGNOSIS — K210 Gastro-esophageal reflux disease with esophagitis: Secondary | ICD-10-CM | POA: Diagnosis not present

## 2018-06-05 DIAGNOSIS — R51 Headache: Secondary | ICD-10-CM | POA: Diagnosis not present

## 2018-06-05 DIAGNOSIS — R1110 Vomiting, unspecified: Secondary | ICD-10-CM | POA: Diagnosis not present

## 2018-06-05 MED ORDER — IOHEXOL 300 MG/ML  SOLN
100.0000 mL | Freq: Once | INTRAMUSCULAR | Status: AC | PRN
  Administered 2018-06-05: 100 mL via INTRAVENOUS

## 2018-06-14 ENCOUNTER — Ambulatory Visit (HOSPITAL_COMMUNITY): Payer: BC Managed Care – PPO

## 2018-06-14 ENCOUNTER — Encounter (HOSPITAL_COMMUNITY): Payer: Self-pay

## 2018-06-15 DIAGNOSIS — M7752 Other enthesopathy of left foot: Secondary | ICD-10-CM | POA: Diagnosis not present

## 2018-06-15 DIAGNOSIS — M79672 Pain in left foot: Secondary | ICD-10-CM | POA: Diagnosis not present

## 2018-06-15 DIAGNOSIS — M205X2 Other deformities of toe(s) (acquired), left foot: Secondary | ICD-10-CM | POA: Diagnosis not present

## 2018-06-15 DIAGNOSIS — M76822 Posterior tibial tendinitis, left leg: Secondary | ICD-10-CM | POA: Diagnosis not present

## 2018-12-05 ENCOUNTER — Other Ambulatory Visit: Payer: Self-pay

## 2018-12-05 DIAGNOSIS — Z20822 Contact with and (suspected) exposure to covid-19: Secondary | ICD-10-CM

## 2018-12-07 LAB — NOVEL CORONAVIRUS, NAA: SARS-CoV-2, NAA: NOT DETECTED

## 2018-12-08 ENCOUNTER — Telehealth: Payer: Self-pay | Admitting: Pulmonary Disease

## 2018-12-08 NOTE — Telephone Encounter (Signed)
Pt calling to get results.  Made him aware those are negative.

## 2019-02-10 ENCOUNTER — Other Ambulatory Visit: Payer: Self-pay

## 2019-02-10 ENCOUNTER — Ambulatory Visit
Admission: EM | Admit: 2019-02-10 | Discharge: 2019-02-10 | Disposition: A | Discharge status: Home / Self Care | Payer: BC Managed Care – PPO | Attending: Emergency Medicine | Admitting: Emergency Medicine

## 2019-02-10 DIAGNOSIS — J069 Acute upper respiratory infection, unspecified: Secondary | ICD-10-CM

## 2019-02-10 DIAGNOSIS — F17200 Nicotine dependence, unspecified, uncomplicated: Secondary | ICD-10-CM | POA: Diagnosis not present

## 2019-02-10 DIAGNOSIS — Z20822 Contact with and (suspected) exposure to covid-19: Secondary | ICD-10-CM | POA: Diagnosis not present

## 2019-02-10 DIAGNOSIS — R059 Cough, unspecified: Secondary | ICD-10-CM

## 2019-02-10 DIAGNOSIS — R05 Cough: Secondary | ICD-10-CM | POA: Diagnosis not present

## 2019-02-10 MED ORDER — ALBUTEROL SULFATE HFA 108 (90 BASE) MCG/ACT IN AERS: 1.0000 | INHALATION_SPRAY | Freq: Four times a day (QID) | RESPIRATORY_TRACT | 0 refills | Status: DC | PRN

## 2019-02-10 MED ORDER — PREDNISONE 20 MG PO TABS: 20.0000 mg | ORAL_TABLET | Freq: Two times a day (BID) | ORAL | 0 refills | Status: AC

## 2019-02-10 MED ORDER — BENZONATATE 100 MG PO CAPS: 100.0000 mg | ORAL_CAPSULE | Freq: Three times a day (TID) | ORAL | 0 refills | Status: DC

## 2019-02-10 NOTE — ED Provider Notes (Signed)
[place]   [id number] 02/10/19 Arrival Time: 4128   CC: Cough  SUBJECTIVE: History from: patient.  SATISH HAMMERS is a 39 y.o. male who presents with productive cough with yellow sputum, and wheezing x 1.5 weeks.  Denies sick exposure to COVID, flu or strep.  However, does admit to tobacco use of 1-2 PPD x 20 years.  Denies hx of COPD, or asthma.  Has tried OTC nyquil/ dayquil with minimal relief.  Denies aggravating factors.  Reports mild congestion, rhinorrhea, and sore throat 2 weeks ago, now resolved.  Denies fever, chills, fatigue, sore throat, SOB, chest pain, nausea, changes in bowel or bladder habits.    ROS: As per HPI.  All other pertinent ROS negative.     Past Medical History:  Diagnosis Date  . Chronic knee pain   . [facility](947.0)    Past Surgical History:  Procedure Laterality Date  . APPENDECTOMY    . COLONOSCOPY     Hx: of  . KNEE BURSECTOMY Left 08/21/2015   Procedure: KNEE BURSECTOMY;  Surgeon: Carole Civil, MD;  Location: [facility];  Service: Orthopedics;  Laterality: Left;  PREPATELLAR  . ORIF WRIST FRACTURE Right 10/13/2012   Procedure: OPEN REDUCTION INTERNAL FIXATION (ORIF)RIGHT 5TH CARPAL METACARPAL FRACTURE HAND  ;  Surgeon: Roseanne Kaufman, MD;  Location: [facility];  Service: Orthopedics;  Laterality: Right;   Allergies  Allergen Reactions  . Avelox [Moxifloxacin Hcl In Nacl]     Hives,cold sweats   No current facility-administered medications on file prior to encounter.   Current Outpatient Medications on File Prior to Encounter  Medication Sig Dispense Refill  . Aspirin-Caffeine 845-65 MG PACK Take 1 packet by mouth as needed.    Marland Kitchen HYDROcodone-acetaminophen (NORCO) 10-325 MG tablet Take 1 tablet by mouth every 6 (six) hours as needed. (Patient not taking: Reported on 07/21/2016) 40 tablet 0  . HYDROcodone-acetaminophen (NORCO/VICODIN) 5-325 MG tablet Take 1 tablet by mouth every 4 (four) hours as needed for moderate pain. (Patient not  taking: Reported on 07/21/2016) 30 tablet 0  . ibuprofen (ADVIL,MOTRIN) 800 MG tablet Take 1 tablet (800 mg total) by mouth every 8 (eight) hours as needed. 21 tablet 0  . naloxone (NARCAN) nasal spray 4 mg/0.1 mL Administer liquid to nose when opiate overdose is suspected. 1 kit 1  . pantoprazole (PROTONIX) 40 MG tablet Take 40 mg by mouth daily.    . ZUBSOLV 5.7-1.4 MG SUBL Take 1 strip by mouth 3 (three) times daily.    . [DISCONTINUED] dicyclomine (BENTYL) 20 MG tablet Take 1 tablet (20 mg total) by mouth 2 (two) times daily. 20 tablet 0   Social History   Socioeconomic History  . Marital status: Single    Spouse name: Not on file  . Number of children: Not on file  . Years of education: Not on file  . Highest education level: Not on file  Occupational History  . Not on file  Tobacco Use  . Smoking status: Current Every Day Smoker    Packs/day: 1.00    Years: 16.00    Pack years: 16.00    Types: Cigarettes  . Smokeless tobacco: Never Used  Substance and Sexual Activity  . Alcohol use: Yes    Comment: occ use  . Drug use: Yes    Comment: occasional pain pills  . Sexual activity: Yes    Birth control/protection: None  Other Topics Concern  . Not on file  Social History Narrative  . Not on  file   Social Determinants of Health   Financial Resource Strain:   . Difficulty of Paying Living Expenses: Not on file  Food Insecurity:   . Worried About Charity fundraiser in the Last Year: Not on file  . Ran Out of Food in the Last Year: Not on file  Transportation Needs:   . Lack of Transportation (Medical): Not on file  . Lack of Transportation (Non-Medical): Not on file  Physical Activity:   . Days of Exercise per Week: Not on file  . Minutes of Exercise per Session: Not on file  Stress:   . Feeling of Stress : Not on file  Social Connections:   . Frequency of Communication with Friends and Family: Not on file  . Frequency of Social Gatherings with Friends and Family:  Not on file  . Attends Religious Services: Not on file  . Active Member of Clubs or Organizations: Not on file  . Attends Archivist Meetings: Not on file  . Marital Status: Not on file  Intimate Partner Violence:   . Fear of Current or Ex-Partner: Not on file  . Emotionally Abused: Not on file  . Physically Abused: Not on file  . Sexually Abused: Not on file   Family History  Problem Relation Age of Onset  . Cancer - Other Other     OBJECTIVE:  Vitals:   02/10/19 0824  BP: (!) 145/90  Pulse: 61  Resp: 17  Temp: 98.2 F (36.8 C)  TempSrc: Oral  SpO2: 94%     General appearance: alert; well-appearing, nontoxic; speaking in full sentences and tolerating own secretions HEENT: NCAT; Ears: EACs clear, TMs pearly gray; Eyes: PERRL.  EOM grossly intact. Nose: nares patent without rhinorrhea, Throat: oropharynx clear, tonsils non erythematous or enlarged, uvula midline  Neck: supple without LAD Lungs: unlabored respirations, symmetrical air entry; cough: mild; no respiratory distress; CTAB, decreased breath sounds at lung bases Heart: regular rate and rhythm.   Skin: warm and dry Psychological: alert and cooperative; normal mood and affect  ASSESSMENT & PLAN:  1. Suspected COVID-19 virus infection   2. Cough   3. Viral URI with cough   4. Tobacco use disorder     Meds ordered this encounter  Medications  . predniSONE (DELTASONE) 20 MG tablet    Sig: Take 1 tablet (20 mg total) by mouth 2 (two) times daily with a meal for 5 days.    Dispense:  10 tablet    Refill:  0    Order Specific Question:   Supervising Provider    Answer:   Raylene Everts [[id number]]  . benzonatate (TESSALON) 100 MG capsule    Sig: Take 1 capsule (100 mg total) by mouth every 8 (eight) hours.    Dispense:  21 capsule    Refill:  0    Order Specific Question:   Supervising Provider    Answer:   Raylene Everts [[id number]]  . albuterol (VENTOLIN HFA) 108 (90 Base) MCG/ACT inhaler     Sig: Inhale 1-2 puffs into the lungs every 6 (six) hours as needed for wheezing or shortness of breath.    Dispense:  18 g    Refill:  0    Order Specific Question:   Supervising Provider    Answer:   Raylene Everts [[id number]]   We will hold off on x-ray today.  If symptoms are not improving over the next few days return and we will consider x-ray at  that time COVID testing ordered.  It will take between 2-5 days for test results.  Someone will contact you regarding abnormal results.    In the meantime: You should remain isolated in your home for 10 days from symptom onset AND greater than 72 hours after symptoms resolution (absence of fever without the use of fever-reducing medication and improvement in respiratory symptoms), whichever is longer Get plenty of rest and push fluids Prednisone prescribed.  Take as directed Albuterol inhaler as needed for shortness of breath and/ or wheezing Tessalon Perles prescribed for cough Use OTC medications like ibuprofen or tylenol as needed fever or pain Follow up with PCP as needed Call or go to the ED if you have any new or worsening symptoms such as fever, worsening cough, shortness of breath, chest tightness, chest pain, turning blue, changes in mental status, etc...   Reviewed expectations re: course of current medical issues. Questions answered. Outlined signs and symptoms indicating need for more acute intervention. Patient verbalized understanding. After Visit Summary given.         Stacey Drain Joaquin, PA-C 02/10/19 [phone]

## 2019-02-10 NOTE — Discharge Instructions (Signed)
We will hold off on x-ray today.  If symptoms are not improving over the next few days return and we will consider x-ray at that time COVID testing ordered.  It will take between 2-5 days for test results.  Someone will contact you regarding abnormal results.    In the meantime: You should remain isolated in your home for 10 days from symptom onset AND greater than 72 hours after symptoms resolution (absence of fever without the use of fever-reducing medication and improvement in respiratory symptoms), whichever is longer Get plenty of rest and push fluids Prednisone prescribed.  Take as directed Albuterol inhaler as needed for shortness of breath and/ or wheezing Tessalon Perles prescribed for cough Use OTC medications like ibuprofen or tylenol as needed fever or pain Follow up with PCP as needed Call or go to the ED if you have any new or worsening symptoms such as fever, worsening cough, shortness of breath, chest tightness, chest pain, turning blue, changes in mental status, etc..Marland Kitchen

## 2019-02-10 NOTE — ED Triage Notes (Signed)
Pt presents with c/o cough for past week  

## 2019-02-11 LAB — NOVEL CORONAVIRUS, NAA: SARS-CoV-2, NAA: NOT DETECTED

## 2019-03-15 ENCOUNTER — Ambulatory Visit
Admission: EM | Admit: 2019-03-15 | Discharge: 2019-03-15 | Disposition: A | Discharge status: Home / Self Care | Payer: BC Managed Care – PPO

## 2019-03-15 ENCOUNTER — Other Ambulatory Visit: Payer: Self-pay

## 2019-03-15 DIAGNOSIS — R112 Nausea with vomiting, unspecified: Secondary | ICD-10-CM

## 2019-03-15 NOTE — ED Triage Notes (Signed)
Pt has frequent episodes of nausea and vomiting . Pt had episode this morning and needs a work note. Pt states that he is following up with GI for same

## 2019-03-15 NOTE — Discharge Instructions (Signed)
Get rest and drink fluids Continue to take Zofran as prescribed.  Take as directed.    DIET Instructions:  30 minutes after taking nausea medicine, begin with sips of clear liquids. If able to hold down 2 - 4 ounces for 30 minutes, begin drinking more. Increase your fluid intake to replace losses. Clear liquids only for 24 hours (water, tea, sport drinks, clear flat ginger ale or cola and juices, broth, jello, popsicles, ect). Advance to bland foods, applesauce, rice, baked or boiled chicken, ect. Avoid milk, greasy foods and anything that doesnt agree with you.  If you experience new or worsening symptoms return or go to ER such as fever, chills, nausea, vomiting, diarrhea, bloody or dark tarry stools, constipation, urinary symptoms, worsening abdominal discomfort, symptoms that do not improve with medications, inability to keep fluids down, etc..Marland Kitchen

## 2019-03-15 NOTE — ED Provider Notes (Signed)
[place]   [id number] 03/15/19 Arrival Time: 1751  CC: ABDOMINAL DISCOMFORT  SUBJECTIVE:  Daniel Hays is a 39 y.o. male who presents with complaint of of nausea and vomiting that started last night.  Denies a precipitating event, trauma, close contacts with similar symptoms, recent travel or antibiotic use.  Report  emesis as of a food color.  Has tried pantoprazole, and Zofran with relief.  Denies  aggravating factors.  Patient reports he has been following with GI for the same issue.  Would like to get a work note.  Denies fever, chills, appetite changes, weight changes,  chest pain, SOB, diarrhea, constipation, hematochezia, melena, dysuria, difficulty urinating, increased frequency or urgency, flank pain, loss of bowel or bladder function,    ROS: As per HPI.  All other pertinent ROS negative.     Past Medical History:  Diagnosis Date  . Chronic knee pain   . WCHENIDP(824.2)    Past Surgical History:  Procedure Laterality Date  . APPENDECTOMY    . COLONOSCOPY     Hx: of  . KNEE BURSECTOMY Left 08/21/2015   Procedure: KNEE BURSECTOMY;  Surgeon: Carole Civil, MD;  Location: [facility];  Service: Orthopedics;  Laterality: Left;  PREPATELLAR  . ORIF WRIST FRACTURE Right 10/13/2012   Procedure: OPEN REDUCTION INTERNAL FIXATION (ORIF)RIGHT 5TH CARPAL METACARPAL FRACTURE HAND  ;  Surgeon: Roseanne Kaufman, MD;  Location: [facility];  Service: Orthopedics;  Laterality: Right;   Allergies  Allergen Reactions  . Avelox [Moxifloxacin Hcl In Nacl]     Hives,cold sweats   No current facility-administered medications on file prior to encounter.   Current Outpatient Medications on File Prior to Encounter  Medication Sig Dispense Refill  . albuterol (VENTOLIN HFA) 108 (90 Base) MCG/ACT inhaler Inhale 1-2 puffs into the lungs every 6 (six) hours as needed for wheezing or shortness of breath. 18 g 0  . Aspirin-Caffeine 845-65 MG PACK Take 1 packet by mouth as needed.    .  benzonatate (TESSALON) 100 MG capsule Take 1 capsule (100 mg total) by mouth every 8 (eight) hours. 21 capsule 0  . ibuprofen (ADVIL,MOTRIN) 800 MG tablet Take 1 tablet (800 mg total) by mouth every 8 (eight) hours as needed. 21 tablet 0  . naloxone (NARCAN) nasal spray 4 mg/0.1 mL Administer liquid to nose when opiate overdose is suspected. 1 kit 1  . pantoprazole (PROTONIX) 40 MG tablet Take 40 mg by mouth daily.    . ZUBSOLV 5.7-1.4 MG SUBL Take 1 strip by mouth 3 (three) times daily.    . [DISCONTINUED] dicyclomine (BENTYL) 20 MG tablet Take 1 tablet (20 mg total) by mouth 2 (two) times daily. 20 tablet 0   Social History   Socioeconomic History  . Marital status: Single    Spouse name: Not on file  . Number of children: Not on file  . Years of education: Not on file  . Highest education level: Not on file  Occupational History  . Not on file  Tobacco Use  . Smoking status: Current Every Day Smoker    Packs/day: 1.00    Years: 16.00    Pack years: 16.00    Types: Cigarettes  . Smokeless tobacco: Never Used  Substance and Sexual Activity  . Alcohol use: Yes    Comment: occ use  . Drug use: Yes    Comment: occasional pain pills  . Sexual activity: Yes    Birth control/protection: None  Other Topics Concern  .  Not on file  Social History Narrative  . Not on file   Social Determinants of Health   Financial Resource Strain:   . Difficulty of Paying Living Expenses:   Food Insecurity:   . Worried About Charity fundraiser in the Last Year:   . Arboriculturist in the Last Year:   Transportation Needs:   . Film/video editor (Medical):   Marland Kitchen Lack of Transportation (Non-Medical):   Physical Activity:   . Days of Exercise per Week:   . Minutes of Exercise per Session:   Stress:   . Feeling of Stress :   Social Connections:   . Frequency of Communication with Friends and Family:   . Frequency of Social Gatherings with Friends and Family:   . Attends Religious  Services:   . Active Member of Clubs or Organizations:   . Attends Archivist Meetings:   Marland Kitchen Marital Status:   Intimate Partner Violence:   . Fear of Current or Ex-Partner:   . Emotionally Abused:   Marland Kitchen Physically Abused:   . Sexually Abused:    Family History  Problem Relation Age of Onset  . Cancer - Other Other      OBJECTIVE:  Vitals:   03/15/19 0827  BP: (!) 142/95  Pulse: 70  Resp: 17  Temp: 98 F (36.7 C)  TempSrc: Oral  SpO2: 98%    General appearance: Alert; NAD HEENT: NCAT.  Oropharynx clear.  Lungs: clear to auscultation bilaterally without adventitious breath sounds Heart: regular rate and rhythm.  Radial pulses 2+ symmetrical bilaterally Abdomen: soft, non-distended; normal active bowel sounds; non-tender to light and deep palpation; nontender at McBurney's point; negative Murphy's sign; negative rebound; no guarding Back: no CVA tenderness Extremities: no edema; symmetrical with no gross deformities Skin: warm and dry Neurologic: normal gait Psychological: alert and cooperative; normal mood and affect  LABS: No results found for this or any previous visit (from the past 24 hour(s)).  DIAGNOSTIC STUDIES: No results found.   ASSESSMENT & PLAN:  1. Nausea and vomiting, intractability of vomiting not specified, unspecified vomiting type    Patient is stable for discharge. Advised patient to take his medication as prescribed Keep in follow-up  with GI Return for worsening of symptoms  No orders of the defined types were placed in this encounter.    Get rest and drink fluids Continue to take Zofran as prescribed.  Take as directed.    DIET Instructions:  30 minutes after taking nausea medicine, begin with sips of clear liquids. If able to hold down 2 - 4 ounces for 30 minutes, begin drinking more. Increase your fluid intake to replace losses. Clear liquids only for 24 hours (water, tea, sport drinks, clear flat ginger ale or cola and  juices, broth, jello, popsicles, ect). Advance to bland foods, applesauce, rice, baked or boiled chicken, ect. Avoid milk, greasy foods and anything that doesn't agree with you.  If you experience new or worsening symptoms return or go to ER such as fever, chills, nausea, vomiting, diarrhea, bloody or dark tarry stools, constipation, urinary symptoms, worsening abdominal discomfort, symptoms that do not improve with medications, inability to keep fluids down, etc...  Reviewed expectations re: course of current medical issues. Questions answered. Outlined signs and symptoms indicating need for more acute intervention. Patient verbalized understanding. After Visit Summary given.   Emerson Monte, FNP 03/15/19 [phone]

## 2019-06-17 ENCOUNTER — Encounter (HOSPITAL_COMMUNITY): Payer: Self-pay

## 2019-06-17 ENCOUNTER — Emergency Department (HOSPITAL_COMMUNITY): Payer: BC Managed Care – PPO

## 2019-06-17 ENCOUNTER — Other Ambulatory Visit: Payer: Self-pay

## 2019-06-17 ENCOUNTER — Emergency Department (HOSPITAL_COMMUNITY)
Admission: EM | Admit: 2019-06-17 | Discharge: 2019-06-18 | Disposition: A | Discharge status: Home / Self Care | Payer: BC Managed Care – PPO | Attending: Emergency Medicine | Admitting: Emergency Medicine

## 2019-06-17 DIAGNOSIS — Y999 Unspecified external cause status: Secondary | ICD-10-CM | POA: Insufficient documentation

## 2019-06-17 DIAGNOSIS — Y9301 Activity, walking, marching and hiking: Secondary | ICD-10-CM | POA: Diagnosis not present

## 2019-06-17 DIAGNOSIS — X58XXXA Exposure to other specified factors, initial encounter: Secondary | ICD-10-CM | POA: Diagnosis not present

## 2019-06-17 DIAGNOSIS — R600 Localized edema: Secondary | ICD-10-CM | POA: Diagnosis not present

## 2019-06-17 DIAGNOSIS — Y929 Unspecified place or not applicable: Secondary | ICD-10-CM | POA: Diagnosis not present

## 2019-06-17 DIAGNOSIS — F17210 Nicotine dependence, cigarettes, uncomplicated: Secondary | ICD-10-CM | POA: Diagnosis not present

## 2019-06-17 DIAGNOSIS — S99912A Unspecified injury of left ankle, initial encounter: Secondary | ICD-10-CM | POA: Diagnosis not present

## 2019-06-17 DIAGNOSIS — S93402A Sprain of unspecified ligament of left ankle, initial encounter: Secondary | ICD-10-CM | POA: Diagnosis not present

## 2019-06-17 MED ORDER — IBUPROFEN 400 MG PO TABS
600.0000 mg | ORAL_TABLET | Freq: Once | ORAL | Status: AC
  Administered 2019-06-18: 600 mg via ORAL
  Filled 2019-06-17: qty 2

## 2019-06-17 MED ORDER — ACETAMINOPHEN 325 MG PO TABS
650.0000 mg | ORAL_TABLET | Freq: Once | ORAL | Status: AC
Start: 2019-06-18 — End: 2019-06-18
  Administered 2019-06-18: 650 mg via ORAL
  Filled 2019-06-17: qty 2

## 2019-06-17 NOTE — ED Triage Notes (Signed)
Pt to er, pt states that about three hours ago he was walking and rolled his L ankle, pt c/o L ankle pain.

## 2019-06-17 NOTE — ED Provider Notes (Signed)
[facility] EMERGENCY DEPARTMENT Provider Note   CSN: [id number] Arrival date & time: 06/17/19  1945   Time seen 11:48 PM  History Chief Complaint  Patient presents with  . Ankle Pain    Daniel Hays is a 39 y.o. male.  HPI   Patient states about 7 PM this evening he was walking outside and he rolled his left ankle without falling.  He complains of pain over the lateral part of his ankle since that time.  He denies knee pain.  He states he has to limp because it hurts to put full weight on it.  PCP Patient, No Pcp Per Ortho Dr Romeo Apple  Past Medical History:  Diagnosis Date  . Chronic knee pain   . WCHENIDP(824.2)     Patient Active Problem List   Diagnosis Date Noted  . Bursitis, knee     Past Surgical History:  Procedure Laterality Date  . APPENDECTOMY    . COLONOSCOPY     Hx: of  . KNEE BURSECTOMY Left 08/21/2015   Procedure: KNEE BURSECTOMY;  Surgeon: Vickki Hearing, MD;  Location: [facility];  Service: Orthopedics;  Laterality: Left;  PREPATELLAR  . ORIF WRIST FRACTURE Right 10/13/2012   Procedure: OPEN REDUCTION INTERNAL FIXATION (ORIF)RIGHT 5TH CARPAL METACARPAL FRACTURE HAND  ;  Surgeon: Dominica Severin, MD;  Location: [facility];  Service: Orthopedics;  Laterality: Right;       Family History  Problem Relation Age of Onset  . Cancer - Other Other     Social History   Tobacco Use  . Smoking status: Current Every Day Smoker    Packs/day: 1.00    Years: 16.00    Pack years: 16.00    Types: Cigarettes  . Smokeless tobacco: Never Used  Substance Use Topics  . Alcohol use: Not Currently  . Drug use: Not Currently  works doing HVAC  Home Medications Prior to Admission medications   Medication Sig Start Date End Date Taking? Authorizing Provider  albuterol (VENTOLIN HFA) 108 (90 Base) MCG/ACT inhaler Inhale 1-2 puffs into the lungs every 6 (six) hours as needed for wheezing or shortness of breath. 02/10/19   Wurst, [place], PA-C  Aspirin-Caffeine [phone]  MG PACK Take 1 packet by mouth as needed.    [provider]  benzonatate (TESSALON) 100 MG capsule Take 1 capsule (100 mg total) by mouth every 8 (eight) hours. 02/10/19   Wurst, [place], PA-C  ibuprofen (ADVIL,MOTRIN) 800 MG tablet Take 1 tablet (800 mg total) by mouth every 8 (eight) hours as needed. 07/21/16   Long, Arlyss Repress, MD  naloxone [facility]) nasal spray 4 mg/0.1 mL Administer liquid to nose when opiate overdose is suspected. 07/21/16   Long, Arlyss Repress, MD  pantoprazole (PROTONIX) 40 MG tablet Take 40 mg by mouth daily. 02/05/19   [provider]  ZUBSOLV 5.7-1.4 MG SUBL Take 1 strip by mouth 3 (three) times daily. 06/06/16   [provider]  dicyclomine (BENTYL) 20 MG tablet Take 1 tablet (20 mg total) by mouth 2 (two) times daily. 07/21/16 02/10/19  Long, Arlyss Repress, MD    Allergies    Avelox [moxifloxacin hcl in nacl]  Review of Systems   Review of Systems  All other systems reviewed and are negative.   Physical Exam Updated Vital Signs BP (!) 161/101 (BP Location: Right Arm)   Pulse (!) 55   Temp 99 F (37.2 C) (Oral)   Resp 18   Ht 6' (1.829 m)   [employer]  99.8 kg   SpO2 95%   BMI 29.84 kg/m   Physical Exam Vitals and nursing note reviewed.  Constitutional:      Appearance: Normal appearance. He is normal weight.  HENT:     Head: Normocephalic and atraumatic.  Eyes:     Extraocular Movements: Extraocular movements intact.     Conjunctiva/sclera: Conjunctivae normal.  Cardiovascular:     Rate and Rhythm: Normal rate.  Pulmonary:     Effort: Pulmonary effort is normal. No respiratory distress.  Musculoskeletal:        General: Swelling present.     Cervical back: Normal range of motion.     Comments: Patient's left knee is nontender and no joint effusion is noted.  He is nontender over the tibia.  He is tender however over the lateral malleolus diffusely with some mild swelling.  He is nontender to palpation over the medial malleolus.  He has some mild  tenderness over the proximal lateral aspect of his foot.  He has good distal pulses and capillary refill.  There is no abrasion seen.  Skin:    General: Skin is warm and dry.     Findings: No bruising, erythema, lesion or rash.  Neurological:     General: No focal deficit present.     Mental Status: He is alert and oriented to person, place, and time.     Cranial Nerves: No cranial nerve deficit.  Psychiatric:        Mood and Affect: Mood normal.        Behavior: Behavior normal.        Thought Content: Thought content normal.     ED Results / Procedures / Treatments   Labs (all labs ordered are listed, but only abnormal results are displayed) Labs Reviewed - No data to display  EKG None  Radiology DG Ankle Complete Left  Result Date: 06/17/2019 CLINICAL DATA:  Left ankle pain after injury. Rolled ankle prior to arrival. Unable to bear weight. Swelling. EXAM: LEFT ANKLE COMPLETE - 3+ VIEW COMPARISON:  None. FINDINGS: There is no evidence of fracture, dislocation, or joint effusion. Mild anterior spurring of the tibial plafond. There is a dorsal talar ridge. Generalized soft tissue edema is more prominent laterally. IMPRESSION: Soft tissue edema without acute fracture or subluxation. Electronically Signed   By: Keith Rake M.D.   On: 06/17/2019 21:16    Procedures Procedures (including critical care time)  Medications Ordered in ED Medications  ibuprofen (ADVIL) tablet 600 mg (has no administration in time range)  acetaminophen (TYLENOL) tablet 650 mg (has no administration in time range)    ED Course  I have reviewed the triage vital signs and the nursing notes.  Pertinent labs & imaging results that were available during my care of the patient were reviewed by me and considered in my medical decision making (see chart for details).    MDM Rules/Calculators/A&P                          We discussed his x-ray results.  Patient has a sprain over his lateral  malleolus.  The patient was placed in an ASO and put on crutches.  He was given ibuprofen and Tylenol for pain.  He has seen Dr. Aline Brochure in the past and he was referred back to him.   Final Clinical Impression(s) / ED Diagnoses Final diagnoses:  None    Rx / DC Orders ED Discharge Orders  None    OTC ibuprofen and acetaminophen  Plan discharge  Devoria Albe, MD, Concha Pyo, MD 06/18/19 0001

## 2019-06-17 NOTE — ED Notes (Signed)
Ice pack given

## 2019-06-18 DIAGNOSIS — S93492A Sprain of other ligament of left ankle, initial encounter: Secondary | ICD-10-CM | POA: Diagnosis not present

## 2019-06-18 NOTE — Discharge Instructions (Addendum)
Elevate your leg, use ice packs for swelling and comfort.  Take ibuprofen 600 mg plus acetaminophen 650 mg every 6 hours as needed for pain.  Use the crutches until you are able to walk on that leg.  Use the lace ankle support for the next several weeks until your ankle sprain can heal.  If you are not improving over the next week, please call Dr. Mort Sawyers office to get an appointment to have him reevaluate your ankle.

## 2019-07-23 ENCOUNTER — Emergency Department (HOSPITAL_COMMUNITY)
Admission: EM | Admit: 2019-07-23 | Discharge: 2019-07-23 | Disposition: A | Discharge status: Home / Self Care | Payer: BC Managed Care – PPO | Attending: Emergency Medicine | Admitting: Emergency Medicine

## 2019-07-23 ENCOUNTER — Encounter (HOSPITAL_COMMUNITY): Payer: Self-pay

## 2019-07-23 ENCOUNTER — Other Ambulatory Visit: Payer: Self-pay

## 2019-07-23 DIAGNOSIS — F17210 Nicotine dependence, cigarettes, uncomplicated: Secondary | ICD-10-CM | POA: Diagnosis not present

## 2019-07-23 DIAGNOSIS — B369 Superficial mycosis, unspecified: Secondary | ICD-10-CM

## 2019-07-23 DIAGNOSIS — Z7982 Long term (current) use of aspirin: Secondary | ICD-10-CM | POA: Diagnosis not present

## 2019-07-23 DIAGNOSIS — R58 Hemorrhage, not elsewhere classified: Secondary | ICD-10-CM | POA: Diagnosis not present

## 2019-07-23 LAB — CBG MONITORING, ED: Glucose-Capillary: 94 mg/dL (ref 70–99)

## 2019-07-23 MED ORDER — CLOTRIMAZOLE-BETAMETHASONE 1-0.05 % EX CREA: 1.0000 "application " | TOPICAL_CREAM | Freq: Two times a day (BID) | CUTANEOUS | 0 refills | Status: DC

## 2019-07-23 NOTE — ED Triage Notes (Signed)
Pt reports that he woke with bloody drainage from belly button. Pt states he is unaware of any injury. Noticed redness around the area this morning and tenderness

## 2019-07-23 NOTE — Medical Student Note (Signed)
[facility]-EMERGENCY DEPT Provider Student Note For educational purposes for Medical, PA and NP students only and not part of the legal medical record.   CSN: [id number] Arrival date & time: 07/23/19  0754      History   Chief Complaint Chief Complaint  Patient presents with  . bleeding from umbilicus    HPI Daniel Hays is a 39 y.o. male presenting with bleeding from the umbilicus since this morning. He reports that he woke up this morning and noticed blood pooling in his umbilicus, which he subsequently took a photo of and then wiped away. The bleeding is localized to the umbilicus, and is not copious. He describes the blood as "mixed with some pus" and denies presence of any bright red blood or clots. He says that the area inferior to his umbilicus was red and tender this morning but currently is neither red nor tender. He says that last year he had bleeding from his umbilicus but it was lesser in volume and he did not do anything to stop the bleeding, and it was self limited. He has history of nausea and vomiting and substance abuse, and he takes Zofran daily. He says he has had 1 episode of emesis in the last two weeks, without any recent vomiting in the last few days. He denies recent abdominal surgery, new type or onset of nausea and vomiting, denies abdominal distension, diarrhea, and reports long standing history of constipation. He admits to abdominal pain that is present only with palpation of the umbilicus and is not present when lying in bed without being palpated.  HPI  Past Medical History:  Diagnosis Date  . Chronic knee pain   . WGNFAOZH(086.5)     Patient Active Problem List   Diagnosis Date Noted  . Bursitis, knee     Past Surgical History:  Procedure Laterality Date  . APPENDECTOMY    . COLONOSCOPY     Hx: of  . KNEE BURSECTOMY Left 08/21/2015   Procedure: KNEE BURSECTOMY;  Surgeon: Vickki Hearing, MD;  Location: [facility];  Service: Orthopedics;  Laterality:  Left;  PREPATELLAR  . ORIF WRIST FRACTURE Right 10/13/2012   Procedure: OPEN REDUCTION INTERNAL FIXATION (ORIF)RIGHT 5TH CARPAL METACARPAL FRACTURE HAND  ;  Surgeon: Dominica Severin, MD;  Location: [facility] OR;  Service: Orthopedics;  Laterality: Right;       Home Medications    Prior to Admission medications   Medication Sig Start Date End Date Taking? Authorizing Provider  albuterol (VENTOLIN HFA) 108 (90 Base) MCG/ACT inhaler Inhale 1-2 puffs into the lungs every 6 (six) hours as needed for wheezing or shortness of breath. 02/10/19   Wurst, [place], PA-C  Aspirin-Caffeine [phone] MG PACK Take 1 packet by mouth as needed.    [provider]  benzonatate (TESSALON) 100 MG capsule Take 1 capsule (100 mg total) by mouth every 8 (eight) hours. 02/10/19   Wurst, [place], PA-C  ibuprofen (ADVIL,MOTRIN) 800 MG tablet Take 1 tablet (800 mg total) by mouth every 8 (eight) hours as needed. 07/21/16   Long, Arlyss Repress, MD  naloxone [facility]) nasal spray 4 mg/0.1 mL Administer liquid to nose when opiate overdose is suspected. 07/21/16   Long, Arlyss Repress, MD  pantoprazole (PROTONIX) 40 MG tablet Take 40 mg by mouth daily. 02/05/19   [provider]  ZUBSOLV 5.7-1.4 MG SUBL Take 1 strip by mouth 3 (three) times daily. 06/06/16   [provider]  dicyclomine (BENTYL) 20 MG tablet Take 1 tablet (  20 mg total) by mouth 2 (two) times daily. 07/21/16 02/10/19  Long, Arlyss Repress, MD    Family History Family History  Problem Relation Age of Onset  . Cancer - Other Other     Social History Social History   Tobacco Use  . Smoking status: Current Every Day Smoker    Packs/day: 1.00    Years: 16.00    Pack years: 16.00    Types: Cigarettes  . Smokeless tobacco: Never Used  Substance Use Topics  . Alcohol use: Not Currently  . Drug use: Not Currently     Allergies   Avelox [moxifloxacin hcl in nacl]   Review of Systems Review of Systems  Constitutional: Negative for activity change and  fever.  Respiratory: Negative for cough and shortness of breath.   Cardiovascular: Negative for chest pain.  Gastrointestinal: Positive for abdominal pain (with palpation at umbilicus). Negative for abdominal distention, diarrhea, nausea and vomiting (hx of. No recent episodes.). Constipation: hx of, no new constipation.  Genitourinary: Negative for difficulty urinating and dysuria.  Skin: Positive for color change (pt reports erythema inferior to umbilicus this morning). Negative for pallor, rash and wound.     Physical Exam Updated Vital Signs BP (!) 137/92 (BP Location: Left Arm)   Pulse 63   Temp 98.5 F (36.9 C) (Oral)   Resp 16   Ht 6' (1.829 m)   Wt 97.5 kg   SpO2 97%   BMI 29.16 kg/m   Physical Exam Constitutional:      General: He is not in acute distress.    Appearance: Normal appearance. He is not toxic-appearing.  HENT:     Head: Normocephalic.  Eyes:     Extraocular Movements: Extraocular movements intact.  Cardiovascular:     Rate and Rhythm: Normal rate and regular rhythm.     Pulses: Normal pulses.  Pulmonary:     Effort: Pulmonary effort is normal.     Breath sounds: Normal breath sounds.  Abdominal:     General: Abdomen is flat.     Palpations: There is no mass.     Tenderness: There is abdominal tenderness (localized to umbilicus). There is no guarding or rebound.  Musculoskeletal:     Cervical back: Normal range of motion and neck supple.  Skin:    General: Skin is warm and dry.     Findings: Lesion (small 1 mm defect deep in umbilicus) present. No erythema or rash.     Comments: Serosanguinous fluid from umbilicus, increased fluid production with palpation. Lesion 1 mm circular deep in umbilicus where fluid is expressed from.   Neurological:     Mental Status: He is alert.      ED Treatments / Results  Labs (all labs ordered are listed, but only abnormal results are displayed) Labs Reviewed - No data to display  EKG  Radiology No  results found.  Procedures Procedures (including critical care time)  Medications Ordered in ED Medications - No data to display   Initial Impression / Assessment and Plan / ED Course  I have reviewed the triage vital signs and the nursing notes.  Pertinent labs & imaging results that were available during my care of the patient were reviewed by me and considered in my medical decision making (see chart for details).   Pt is a 39 yo male presenting with umbilical bleeding since this morning. He is hemodynamically stable. PE shows small 1 mm defect deep in umbilicus with serosanguinous fluid from defect. Skin  exam shows no evidence of cellulitis, umbilicus is mildly erythematous and defect is deep within umbilicus. Low concern for abdominal etiology such as hernia, or abscess given PE no tenderness, mass, fluctuance appreciable.  Will discharge home with topical antifungal cream for likely fungal infection and given strict return precautions if sx continue after a few days, or worsen.    Final Clinical Impressions(s) / ED Diagnoses   Final diagnoses:  None    New Prescriptions New Prescriptions   No medications on file

## 2019-07-23 NOTE — ED Provider Notes (Signed)
[facility] EMERGENCY DEPARTMENT Provider Note   CSN: [id number] Arrival date & time: 07/23/19  0754     History Chief Complaint  Patient presents with  . bleeding from umbilicus    Daniel Hays is a 39 y.o. male.  HPI      Daniel Hays is a 39 y.o. male who presents to the Emergency Department complaining of bleeding and yellow drainage from his bellybutton this morning.  He states that he woke with fluid pulled into his bellybutton.  He has had this happen previously.  He describes a tenderness down inside his bellybutton.  He denies trauma or known injury.  He states that he works in [employer] and sweats profusely during his job.  He states that he has to change his shirt multiple times per day due to sweating.  He also states that he has issues with acid reflux and has vomiting that occurs frequently due to his reflux.  He denies abdominal pain or swelling, fever, chills, recent vomiting, and history of abdominal hernias.  No therapies prior to arrival.  No history of diabetes.     Past Medical History:  Diagnosis Date  . Chronic knee pain   . BTDVVOHY(073.7)     Patient Active Problem List   Diagnosis Date Noted  . Bursitis, knee     Past Surgical History:  Procedure Laterality Date  . APPENDECTOMY    . COLONOSCOPY     Hx: of  . KNEE BURSECTOMY Left 08/21/2015   Procedure: KNEE BURSECTOMY;  Surgeon: Vickki Hearing, MD;  Location: [facility];  Service: Orthopedics;  Laterality: Left;  PREPATELLAR  . ORIF WRIST FRACTURE Right 10/13/2012   Procedure: OPEN REDUCTION INTERNAL FIXATION (ORIF)RIGHT 5TH CARPAL METACARPAL FRACTURE HAND  ;  Surgeon: Dominica Severin, MD;  Location: [facility] OR;  Service: Orthopedics;  Laterality: Right;       Family History  Problem Relation Age of Onset  . Cancer - Other Other     Social History   Tobacco Use  . Smoking status: Current Every Day Smoker    Packs/day: 1.00    Years: 16.00    Pack years: 16.00    Types:  Cigarettes  . Smokeless tobacco: Never Used  Substance Use Topics  . Alcohol use: Not Currently  . Drug use: Not Currently    Home Medications Prior to Admission medications   Medication Sig Start Date End Date Taking? Authorizing Provider  pantoprazole (PROTONIX) 40 MG tablet Take 40 mg by mouth daily. 02/05/19  Yes [provider]  albuterol (VENTOLIN HFA) 108 (90 Base) MCG/ACT inhaler Inhale 1-2 puffs into the lungs every 6 (six) hours as needed for wheezing or shortness of breath. 02/10/19   Wurst, [place], PA-C  Aspirin-Caffeine [phone] MG PACK Take 1 packet by mouth as needed.    [provider]  benzonatate (TESSALON) 100 MG capsule Take 1 capsule (100 mg total) by mouth every 8 (eight) hours. Patient not taking: Reported on 07/23/2019 02/10/19   Wurst, [place], PA-C  ibuprofen (ADVIL,MOTRIN) 800 MG tablet Take 1 tablet (800 mg total) by mouth every 8 (eight) hours as needed. 07/21/16   Long, Arlyss Repress, MD  naloxone [facility]) nasal spray 4 mg/0.1 mL Administer liquid to nose when opiate overdose is suspected. Patient not taking: Reported on 07/23/2019 07/21/16   Long, Arlyss Repress, MD  ondansetron (ZOFRAN-ODT) 8 MG disintegrating tablet Take 8 mg by mouth 3 (three) times daily as needed. 07/05/19   [provider]  ZUBSOLV 5.7-1.4 MG SUBL Take 1 strip by mouth 3 (three) times daily. Patient not taking: Reported on 07/23/2019 06/06/16   [provider]  dicyclomine (BENTYL) 20 MG tablet Take 1 tablet (20 mg total) by mouth 2 (two) times daily. 07/21/16 02/10/19  LongArlyss Repress, MD    Allergies    Avelox [moxifloxacin hcl in nacl]  Review of Systems   Review of Systems  Constitutional: Negative for activity change, appetite change, chills and fever.  HENT: Negative for facial swelling, sore throat and trouble swallowing.   Respiratory: Negative for shortness of breath.   Cardiovascular: Negative for chest pain.  Gastrointestinal: Negative for abdominal distention,  abdominal pain, diarrhea, nausea and vomiting.       Yellow to pink fluid in his umbilicus  Genitourinary: Negative for decreased urine volume, difficulty urinating, discharge, dysuria, flank pain, penile swelling, scrotal swelling and testicular pain.  Musculoskeletal: Negative for neck pain and neck stiffness.  Skin: Negative for rash and wound.  Neurological: Negative for dizziness, weakness, numbness and headaches.    Physical Exam Updated Vital Signs BP (!) 137/92 (BP Location: Left Arm)   Pulse 63   Temp 98.5 F (36.9 C) (Oral)   Resp 16   Ht 6' (1.829 m)   Wt 97.5 kg   SpO2 97%   BMI 29.16 kg/m   Physical Exam Nursing note reviewed.  Constitutional:      Appearance: Normal appearance. He is not ill-appearing or toxic-appearing.  Cardiovascular:     Rate and Rhythm: Normal rate and regular rhythm.     Pulses: Normal pulses.  Pulmonary:     Effort: Pulmonary effort is normal.     Breath sounds: Normal breath sounds.  Abdominal:     General: There is no distension.     Palpations: Abdomen is soft. There is no mass.     Tenderness: There is no abdominal tenderness.     Hernia: No hernia is present.     Comments: Mild erythema and crusty, serosanguineous drainage deep within the umbilicus.  No surrounding erythema or edema.  Abdomen is soft, nontender on exam.  No palpable hernias.  Musculoskeletal:        General: Normal range of motion.  Skin:    General: Skin is warm.     Capillary Refill: Capillary refill takes less than 2 seconds.  Neurological:     General: No focal deficit present.     Mental Status: He is alert.     Sensory: No sensory deficit.     Motor: No weakness.     ED Results / Procedures / Treatments   Labs (all labs ordered are listed, but only abnormal results are displayed) Labs Reviewed - No data to display  EKG None  Radiology No results found.  Procedures Procedures (including critical care time)  Medications Ordered in  ED Medications - No data to display  ED Course  I have reviewed the triage vital signs and the nursing notes.  Pertinent labs & imaging results that were available during my care of the patient were reviewed by me and considered in my medical decision making (see chart for details).    MDM Rules/Calculators/A&P                          Patient with mild serosanguineous crusting within the umbilicus.  No palpable hernias abdomen is soft and nontender on exam.  No edema noted.  No  clinical evidence of trauma.  Symptoms are felt to be related to fungal infection of the skin.  Patient counseled on keeping area clean and dry, will prescribe antifungal cream to apply topically.  He was given strict return precautions.  He appears appropriate for discharge home.  Final Clinical Impression(s) / ED Diagnoses Final diagnoses:  Superficial fungus infection of skin    Rx / DC Orders ED Discharge Orders    None       Rosey Bath 07/25/19 1204    Bethann Berkshire, MD 07/28/19 0900

## 2019-07-23 NOTE — Discharge Instructions (Signed)
You likely have a fungal infection of the skin within your bellybutton.  It is important that you try to keep the area dry as possible.  You may use a hair dryer on a cool setting to dry your bellybutton after bathing.  Apply the cream as directed, especially after bathing.  Return to the emergency department if you develop any worsening symptoms such as abdominal pain, fever, chills, swelling or vomiting.

## 2019-10-21 DIAGNOSIS — F1120 Opioid dependence, uncomplicated: Secondary | ICD-10-CM | POA: Diagnosis not present

## 2019-10-31 DIAGNOSIS — I10 Essential (primary) hypertension: Secondary | ICD-10-CM | POA: Diagnosis not present

## 2019-10-31 DIAGNOSIS — R0789 Other chest pain: Secondary | ICD-10-CM | POA: Diagnosis not present

## 2019-10-31 DIAGNOSIS — R079 Chest pain, unspecified: Secondary | ICD-10-CM | POA: Diagnosis not present

## 2019-10-31 DIAGNOSIS — R001 Bradycardia, unspecified: Secondary | ICD-10-CM | POA: Diagnosis not present

## 2019-11-01 DIAGNOSIS — R7301 Impaired fasting glucose: Secondary | ICD-10-CM | POA: Diagnosis not present

## 2019-11-01 DIAGNOSIS — M25542 Pain in joints of left hand: Secondary | ICD-10-CM | POA: Diagnosis not present

## 2019-11-01 DIAGNOSIS — F10280 Alcohol dependence with alcohol-induced anxiety disorder: Secondary | ICD-10-CM | POA: Diagnosis not present

## 2019-11-01 DIAGNOSIS — Z0000 Encounter for general adult medical examination without abnormal findings: Secondary | ICD-10-CM | POA: Diagnosis not present

## 2019-11-01 DIAGNOSIS — F064 Anxiety disorder due to known physiological condition: Secondary | ICD-10-CM | POA: Diagnosis not present

## 2019-11-01 DIAGNOSIS — R002 Palpitations: Secondary | ICD-10-CM | POA: Diagnosis not present

## 2019-11-01 DIAGNOSIS — F1910 Other psychoactive substance abuse, uncomplicated: Secondary | ICD-10-CM | POA: Diagnosis not present

## 2019-11-01 DIAGNOSIS — Z0189 Encounter for other specified special examinations: Secondary | ICD-10-CM | POA: Diagnosis not present

## 2019-11-07 DIAGNOSIS — F1120 Opioid dependence, uncomplicated: Secondary | ICD-10-CM | POA: Diagnosis not present

## 2019-11-19 DIAGNOSIS — F1120 Opioid dependence, uncomplicated: Secondary | ICD-10-CM | POA: Diagnosis not present

## 2019-12-12 DIAGNOSIS — F1120 Opioid dependence, uncomplicated: Secondary | ICD-10-CM | POA: Diagnosis not present

## 2020-01-04 DIAGNOSIS — F1120 Opioid dependence, uncomplicated: Secondary | ICD-10-CM | POA: Diagnosis not present

## 2020-01-09 DIAGNOSIS — F1120 Opioid dependence, uncomplicated: Secondary | ICD-10-CM | POA: Diagnosis not present

## 2020-01-16 DIAGNOSIS — F1120 Opioid dependence, uncomplicated: Secondary | ICD-10-CM | POA: Diagnosis not present

## 2020-01-17 DIAGNOSIS — F1120 Opioid dependence, uncomplicated: Secondary | ICD-10-CM | POA: Diagnosis not present

## 2020-01-18 DIAGNOSIS — F1120 Opioid dependence, uncomplicated: Secondary | ICD-10-CM | POA: Diagnosis not present

## 2020-01-21 DIAGNOSIS — F1120 Opioid dependence, uncomplicated: Secondary | ICD-10-CM | POA: Diagnosis not present

## 2020-01-22 DIAGNOSIS — F1120 Opioid dependence, uncomplicated: Secondary | ICD-10-CM | POA: Diagnosis not present

## 2020-01-23 DIAGNOSIS — F1120 Opioid dependence, uncomplicated: Secondary | ICD-10-CM | POA: Diagnosis not present

## 2020-01-25 DIAGNOSIS — F1120 Opioid dependence, uncomplicated: Secondary | ICD-10-CM | POA: Diagnosis not present

## 2020-01-27 DIAGNOSIS — F1120 Opioid dependence, uncomplicated: Secondary | ICD-10-CM | POA: Diagnosis not present

## 2020-01-28 DIAGNOSIS — F1120 Opioid dependence, uncomplicated: Secondary | ICD-10-CM | POA: Diagnosis not present

## 2020-01-29 DIAGNOSIS — F1120 Opioid dependence, uncomplicated: Secondary | ICD-10-CM | POA: Diagnosis not present

## 2020-01-30 DIAGNOSIS — F1120 Opioid dependence, uncomplicated: Secondary | ICD-10-CM | POA: Diagnosis not present

## 2020-01-31 DIAGNOSIS — F1120 Opioid dependence, uncomplicated: Secondary | ICD-10-CM | POA: Diagnosis not present

## 2020-02-03 DIAGNOSIS — F1120 Opioid dependence, uncomplicated: Secondary | ICD-10-CM | POA: Diagnosis not present

## 2020-02-04 DIAGNOSIS — F1120 Opioid dependence, uncomplicated: Secondary | ICD-10-CM | POA: Diagnosis not present

## 2020-02-05 DIAGNOSIS — F1120 Opioid dependence, uncomplicated: Secondary | ICD-10-CM | POA: Diagnosis not present

## 2020-02-06 DIAGNOSIS — F1120 Opioid dependence, uncomplicated: Secondary | ICD-10-CM | POA: Diagnosis not present

## 2020-02-07 DIAGNOSIS — F1120 Opioid dependence, uncomplicated: Secondary | ICD-10-CM | POA: Diagnosis not present

## 2020-02-08 DIAGNOSIS — F1120 Opioid dependence, uncomplicated: Secondary | ICD-10-CM | POA: Diagnosis not present

## 2020-02-10 DIAGNOSIS — F1120 Opioid dependence, uncomplicated: Secondary | ICD-10-CM | POA: Diagnosis not present

## 2020-02-11 DIAGNOSIS — F1120 Opioid dependence, uncomplicated: Secondary | ICD-10-CM | POA: Diagnosis not present

## 2020-02-12 DIAGNOSIS — F1120 Opioid dependence, uncomplicated: Secondary | ICD-10-CM | POA: Diagnosis not present

## 2020-02-13 DIAGNOSIS — F1120 Opioid dependence, uncomplicated: Secondary | ICD-10-CM | POA: Diagnosis not present

## 2020-02-14 DIAGNOSIS — F1120 Opioid dependence, uncomplicated: Secondary | ICD-10-CM | POA: Diagnosis not present

## 2020-02-15 DIAGNOSIS — F1120 Opioid dependence, uncomplicated: Secondary | ICD-10-CM | POA: Diagnosis not present

## 2020-02-17 DIAGNOSIS — F1120 Opioid dependence, uncomplicated: Secondary | ICD-10-CM | POA: Diagnosis not present

## 2020-02-18 DIAGNOSIS — F1120 Opioid dependence, uncomplicated: Secondary | ICD-10-CM | POA: Diagnosis not present

## 2020-02-19 DIAGNOSIS — F1120 Opioid dependence, uncomplicated: Secondary | ICD-10-CM | POA: Diagnosis not present

## 2020-02-20 DIAGNOSIS — F1120 Opioid dependence, uncomplicated: Secondary | ICD-10-CM | POA: Diagnosis not present

## 2020-02-21 DIAGNOSIS — F1120 Opioid dependence, uncomplicated: Secondary | ICD-10-CM | POA: Diagnosis not present

## 2020-02-22 DIAGNOSIS — F1120 Opioid dependence, uncomplicated: Secondary | ICD-10-CM | POA: Diagnosis not present

## 2020-02-24 DIAGNOSIS — F1120 Opioid dependence, uncomplicated: Secondary | ICD-10-CM | POA: Diagnosis not present

## 2020-02-25 DIAGNOSIS — F1120 Opioid dependence, uncomplicated: Secondary | ICD-10-CM | POA: Diagnosis not present

## 2020-02-26 DIAGNOSIS — F1120 Opioid dependence, uncomplicated: Secondary | ICD-10-CM | POA: Diagnosis not present

## 2020-02-27 DIAGNOSIS — F1120 Opioid dependence, uncomplicated: Secondary | ICD-10-CM | POA: Diagnosis not present

## 2020-02-28 DIAGNOSIS — F1120 Opioid dependence, uncomplicated: Secondary | ICD-10-CM | POA: Diagnosis not present

## 2020-02-29 DIAGNOSIS — F1120 Opioid dependence, uncomplicated: Secondary | ICD-10-CM | POA: Diagnosis not present

## 2020-03-02 DIAGNOSIS — F1120 Opioid dependence, uncomplicated: Secondary | ICD-10-CM | POA: Diagnosis not present

## 2020-03-03 DIAGNOSIS — F1120 Opioid dependence, uncomplicated: Secondary | ICD-10-CM | POA: Diagnosis not present

## 2020-03-04 DIAGNOSIS — F1120 Opioid dependence, uncomplicated: Secondary | ICD-10-CM | POA: Diagnosis not present

## 2020-03-05 DIAGNOSIS — F1120 Opioid dependence, uncomplicated: Secondary | ICD-10-CM | POA: Diagnosis not present

## 2020-03-06 DIAGNOSIS — F1120 Opioid dependence, uncomplicated: Secondary | ICD-10-CM | POA: Diagnosis not present

## 2020-03-07 DIAGNOSIS — F1120 Opioid dependence, uncomplicated: Secondary | ICD-10-CM | POA: Diagnosis not present

## 2020-03-09 DIAGNOSIS — F1120 Opioid dependence, uncomplicated: Secondary | ICD-10-CM | POA: Diagnosis not present

## 2020-03-10 DIAGNOSIS — F1120 Opioid dependence, uncomplicated: Secondary | ICD-10-CM | POA: Diagnosis not present

## 2020-03-11 DIAGNOSIS — F1120 Opioid dependence, uncomplicated: Secondary | ICD-10-CM | POA: Diagnosis not present

## 2020-03-12 DIAGNOSIS — F1120 Opioid dependence, uncomplicated: Secondary | ICD-10-CM | POA: Diagnosis not present

## 2020-03-13 DIAGNOSIS — F1120 Opioid dependence, uncomplicated: Secondary | ICD-10-CM | POA: Diagnosis not present

## 2020-03-14 DIAGNOSIS — F1120 Opioid dependence, uncomplicated: Secondary | ICD-10-CM | POA: Diagnosis not present

## 2020-03-16 DIAGNOSIS — F1120 Opioid dependence, uncomplicated: Secondary | ICD-10-CM | POA: Diagnosis not present

## 2020-03-17 DIAGNOSIS — F1120 Opioid dependence, uncomplicated: Secondary | ICD-10-CM | POA: Diagnosis not present

## 2020-03-18 DIAGNOSIS — F1120 Opioid dependence, uncomplicated: Secondary | ICD-10-CM | POA: Diagnosis not present

## 2020-03-19 DIAGNOSIS — F1120 Opioid dependence, uncomplicated: Secondary | ICD-10-CM | POA: Diagnosis not present

## 2020-03-20 DIAGNOSIS — F1120 Opioid dependence, uncomplicated: Secondary | ICD-10-CM | POA: Diagnosis not present

## 2020-03-21 DIAGNOSIS — F1120 Opioid dependence, uncomplicated: Secondary | ICD-10-CM | POA: Diagnosis not present

## 2020-03-23 DIAGNOSIS — F1120 Opioid dependence, uncomplicated: Secondary | ICD-10-CM | POA: Diagnosis not present

## 2020-03-24 DIAGNOSIS — F1120 Opioid dependence, uncomplicated: Secondary | ICD-10-CM | POA: Diagnosis not present

## 2020-03-25 DIAGNOSIS — F1120 Opioid dependence, uncomplicated: Secondary | ICD-10-CM | POA: Diagnosis not present

## 2020-03-26 DIAGNOSIS — F1120 Opioid dependence, uncomplicated: Secondary | ICD-10-CM | POA: Diagnosis not present

## 2020-03-27 DIAGNOSIS — F1120 Opioid dependence, uncomplicated: Secondary | ICD-10-CM | POA: Diagnosis not present

## 2020-03-28 DIAGNOSIS — F1120 Opioid dependence, uncomplicated: Secondary | ICD-10-CM | POA: Diagnosis not present

## 2020-03-30 DIAGNOSIS — F1120 Opioid dependence, uncomplicated: Secondary | ICD-10-CM | POA: Diagnosis not present

## 2020-03-31 DIAGNOSIS — F1120 Opioid dependence, uncomplicated: Secondary | ICD-10-CM | POA: Diagnosis not present

## 2020-04-01 DIAGNOSIS — F1120 Opioid dependence, uncomplicated: Secondary | ICD-10-CM | POA: Diagnosis not present

## 2020-04-02 DIAGNOSIS — F1120 Opioid dependence, uncomplicated: Secondary | ICD-10-CM | POA: Diagnosis not present

## 2020-04-03 DIAGNOSIS — F1120 Opioid dependence, uncomplicated: Secondary | ICD-10-CM | POA: Diagnosis not present

## 2020-04-04 DIAGNOSIS — F1120 Opioid dependence, uncomplicated: Secondary | ICD-10-CM | POA: Diagnosis not present

## 2020-04-06 DIAGNOSIS — F1120 Opioid dependence, uncomplicated: Secondary | ICD-10-CM | POA: Diagnosis not present

## 2020-04-07 DIAGNOSIS — F1120 Opioid dependence, uncomplicated: Secondary | ICD-10-CM | POA: Diagnosis not present

## 2020-04-08 DIAGNOSIS — F1120 Opioid dependence, uncomplicated: Secondary | ICD-10-CM | POA: Diagnosis not present

## 2020-04-09 DIAGNOSIS — F1120 Opioid dependence, uncomplicated: Secondary | ICD-10-CM | POA: Diagnosis not present

## 2020-04-10 DIAGNOSIS — F1120 Opioid dependence, uncomplicated: Secondary | ICD-10-CM | POA: Diagnosis not present

## 2020-04-11 DIAGNOSIS — F1120 Opioid dependence, uncomplicated: Secondary | ICD-10-CM | POA: Diagnosis not present

## 2020-04-13 DIAGNOSIS — F1120 Opioid dependence, uncomplicated: Secondary | ICD-10-CM | POA: Diagnosis not present

## 2020-04-14 DIAGNOSIS — F1120 Opioid dependence, uncomplicated: Secondary | ICD-10-CM | POA: Diagnosis not present

## 2020-04-15 DIAGNOSIS — F1120 Opioid dependence, uncomplicated: Secondary | ICD-10-CM | POA: Diagnosis not present

## 2020-04-16 DIAGNOSIS — F1120 Opioid dependence, uncomplicated: Secondary | ICD-10-CM | POA: Diagnosis not present

## 2020-04-21 DIAGNOSIS — F1120 Opioid dependence, uncomplicated: Secondary | ICD-10-CM | POA: Diagnosis not present

## 2020-04-22 DIAGNOSIS — F1120 Opioid dependence, uncomplicated: Secondary | ICD-10-CM | POA: Diagnosis not present

## 2020-04-23 DIAGNOSIS — F1120 Opioid dependence, uncomplicated: Secondary | ICD-10-CM | POA: Diagnosis not present

## 2020-04-24 DIAGNOSIS — F1120 Opioid dependence, uncomplicated: Secondary | ICD-10-CM | POA: Diagnosis not present

## 2020-04-28 DIAGNOSIS — F1120 Opioid dependence, uncomplicated: Secondary | ICD-10-CM | POA: Diagnosis not present

## 2020-04-29 DIAGNOSIS — F1120 Opioid dependence, uncomplicated: Secondary | ICD-10-CM | POA: Diagnosis not present

## 2020-04-30 DIAGNOSIS — F1120 Opioid dependence, uncomplicated: Secondary | ICD-10-CM | POA: Diagnosis not present

## 2020-05-01 DIAGNOSIS — F1120 Opioid dependence, uncomplicated: Secondary | ICD-10-CM | POA: Diagnosis not present

## 2020-05-05 DIAGNOSIS — F1120 Opioid dependence, uncomplicated: Secondary | ICD-10-CM | POA: Diagnosis not present

## 2020-05-06 DIAGNOSIS — F1120 Opioid dependence, uncomplicated: Secondary | ICD-10-CM | POA: Diagnosis not present

## 2020-05-07 DIAGNOSIS — F1120 Opioid dependence, uncomplicated: Secondary | ICD-10-CM | POA: Diagnosis not present

## 2020-05-08 DIAGNOSIS — F1120 Opioid dependence, uncomplicated: Secondary | ICD-10-CM | POA: Diagnosis not present

## 2020-05-10 DIAGNOSIS — F1910 Other psychoactive substance abuse, uncomplicated: Secondary | ICD-10-CM | POA: Insufficient documentation

## 2020-05-10 DIAGNOSIS — Z720 Tobacco use: Secondary | ICD-10-CM | POA: Insufficient documentation

## 2020-05-10 DIAGNOSIS — F419 Anxiety disorder, unspecified: Secondary | ICD-10-CM | POA: Insufficient documentation

## 2020-05-10 DIAGNOSIS — F1020 Alcohol dependence, uncomplicated: Secondary | ICD-10-CM | POA: Insufficient documentation

## 2020-05-11 DIAGNOSIS — Z6829 Body mass index (BMI) 29.0-29.9, adult: Secondary | ICD-10-CM | POA: Diagnosis not present

## 2020-05-11 DIAGNOSIS — F1120 Opioid dependence, uncomplicated: Secondary | ICD-10-CM | POA: Diagnosis not present

## 2020-05-11 DIAGNOSIS — F064 Anxiety disorder due to known physiological condition: Secondary | ICD-10-CM | POA: Diagnosis not present

## 2020-05-11 DIAGNOSIS — R002 Palpitations: Secondary | ICD-10-CM | POA: Diagnosis not present

## 2020-05-11 DIAGNOSIS — E663 Overweight: Secondary | ICD-10-CM | POA: Diagnosis not present

## 2020-05-12 DIAGNOSIS — F1120 Opioid dependence, uncomplicated: Secondary | ICD-10-CM | POA: Diagnosis not present

## 2020-05-12 DIAGNOSIS — K219 Gastro-esophageal reflux disease without esophagitis: Secondary | ICD-10-CM | POA: Insufficient documentation

## 2020-05-13 DIAGNOSIS — F1120 Opioid dependence, uncomplicated: Secondary | ICD-10-CM | POA: Diagnosis not present

## 2020-05-14 DIAGNOSIS — K219 Gastro-esophageal reflux disease without esophagitis: Secondary | ICD-10-CM | POA: Diagnosis not present

## 2020-05-14 DIAGNOSIS — E875 Hyperkalemia: Secondary | ICD-10-CM | POA: Diagnosis not present

## 2020-05-14 DIAGNOSIS — F419 Anxiety disorder, unspecified: Secondary | ICD-10-CM | POA: Diagnosis not present

## 2020-05-14 DIAGNOSIS — E785 Hyperlipidemia, unspecified: Secondary | ICD-10-CM | POA: Diagnosis not present

## 2020-05-14 DIAGNOSIS — F1120 Opioid dependence, uncomplicated: Secondary | ICD-10-CM | POA: Diagnosis not present

## 2020-05-15 DIAGNOSIS — F1120 Opioid dependence, uncomplicated: Secondary | ICD-10-CM | POA: Diagnosis not present

## 2020-05-22 DIAGNOSIS — F1120 Opioid dependence, uncomplicated: Secondary | ICD-10-CM | POA: Diagnosis not present

## 2020-05-29 DIAGNOSIS — F1120 Opioid dependence, uncomplicated: Secondary | ICD-10-CM | POA: Diagnosis not present

## 2020-06-02 ENCOUNTER — Ambulatory Visit: Payer: BC Managed Care – PPO | Admitting: Orthopaedic Surgery

## 2020-06-05 DIAGNOSIS — F1120 Opioid dependence, uncomplicated: Secondary | ICD-10-CM | POA: Diagnosis not present

## 2020-06-12 DIAGNOSIS — F1120 Opioid dependence, uncomplicated: Secondary | ICD-10-CM | POA: Diagnosis not present

## 2020-06-16 ENCOUNTER — Ambulatory Visit: Payer: BC Managed Care – PPO | Admitting: Orthopaedic Surgery

## 2020-06-16 ENCOUNTER — Ambulatory Visit: Payer: BC Managed Care – PPO

## 2020-06-16 ENCOUNTER — Encounter: Payer: Self-pay | Admitting: Orthopaedic Surgery

## 2020-06-16 ENCOUNTER — Other Ambulatory Visit: Payer: Self-pay

## 2020-06-16 VITALS — BP 136/91 | HR 68 | Ht 72.0 in | Wt 207.4 lb

## 2020-06-16 DIAGNOSIS — M25532 Pain in left wrist: Secondary | ICD-10-CM

## 2020-06-16 DIAGNOSIS — G8929 Other chronic pain: Secondary | ICD-10-CM | POA: Diagnosis not present

## 2020-06-16 DIAGNOSIS — M778 Other enthesopathies, not elsewhere classified: Secondary | ICD-10-CM | POA: Diagnosis not present

## 2020-06-16 MED ORDER — PREDNISONE 10 MG (21) PO TBPK: ORAL_TABLET | ORAL | 1 refills | Status: DC

## 2020-06-16 NOTE — Progress Notes (Signed)
Subjective:    Patient ID: Daniel Hays, male    DOB: Apr 27, 1980, 40 y.o.   MRN: [id number]  HPI He has had worsening pain in the left dorsal to ulnar wrist for three to four months.  He was seen by Dr. Dwana Melena who referred him here.  I have reviewed the notes.  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  By the end of his shift he has marked ulnar wrist pain.  He has more pain trying to flex the wrist.  He has no trauma, no redness but he has lateral swelling of the wrist.  He takes BCs often for headaches and is taking more for the wrist now.  It helps some.  He has not used rubs or a brace.  He has no numbness.   Review of Systems  Constitutional:  Positive for activity change.  Musculoskeletal:  Positive for arthralgias, joint swelling and myalgias.  Neurological:  Positive for headaches.  All other systems reviewed and are negative. For Review of Systems, all other systems reviewed and are negative.  The following is a summary of the past history medically, past history surgically, known current medicines, social history and family history.  This information is gathered electronically by the computer from prior information and documentation.  I review this each visit and have found including this information at this point in the chart is beneficial and informative.   Past Medical History:  Diagnosis Date   Chronic knee pain    Headache(784.0)     Past Surgical History:  Procedure Laterality Date   APPENDECTOMY     COLONOSCOPY     Hx: of   KNEE BURSECTOMY Left 08/21/2015   Procedure: KNEE BURSECTOMY;  Surgeon: Vickki Hearing, MD;  Location: [facility];  Service: Orthopedics;  Laterality: Left;  PREPATELLAR   ORIF WRIST FRACTURE Right 10/13/2012   Procedure: OPEN REDUCTION INTERNAL FIXATION (ORIF)RIGHT 5TH CARPAL METACARPAL FRACTURE HAND  ;  Surgeon: Dominica Severin, MD;  Location: [facility] OR;  Service:  Orthopedics;  Laterality: Right;    Current Outpatient Medications on File Prior to Visit  Medication Sig Dispense Refill   APO-VARENICLINE 0.5 MG tablet Take 0.5 mg by mouth in the morning and at bedtime.     Aspirin-Caffeine 845-65 MG PACK Take 1 packet by mouth as needed.     busPIRone (BUSPAR) 5 MG tablet Take 5 mg by mouth 2 (two) times daily.     ondansetron (ZOFRAN-ODT) 8 MG disintegrating tablet Take 8 mg by mouth every 8 (eight) hours as needed for nausea or vomiting.     pantoprazole (PROTONIX) 40 MG tablet Take 40 mg by mouth daily.     [DISCONTINUED] dicyclomine (BENTYL) 20 MG tablet Take 1 tablet (20 mg total) by mouth 2 (two) times daily. 20 tablet 0   No current facility-administered medications on file prior to visit.    Social History   Socioeconomic History   Marital status: Single    Spouse name: Not on file   Number of children: Not on file   Years of education: Not on file   Highest education level: Not on file  Occupational History   Not on file  Tobacco Use   Smoking status: Every Day    Packs/day: 1.00    Years: 16.00    Pack years: 16.00    Types: Cigarettes   Smokeless tobacco: Never  Substance  and Sexual Activity   Alcohol use: Not Currently   Drug use: Not Currently   Sexual activity: Yes    Birth control/protection: None  Other Topics Concern   Not on file  Social History Narrative   Not on file   Social Determinants of Health   Financial Resource Strain: Not on file  Food Insecurity: Not on file  Transportation Needs: Not on file  Physical Activity: Not on file  Stress: Not on file  Social Connections: Not on file  Intimate Partner Violence: Not on file    Family History  Problem Relation Age of Onset   Cancer - Other Other     BP (!) 136/91   Pulse 68   Ht 6' (1.829 m)   Wt 207 lb 6.4 oz (94.1 kg)   BMI 28.13 kg/m   Body mass index is 28.13 kg/m.     Objective:   Physical Exam Vitals and nursing note reviewed. Exam  conducted with a chaperone present.  Constitutional:      Appearance: He is well-developed.  HENT:     Head: Normocephalic and atraumatic.  Eyes:     Conjunctiva/sclera: Conjunctivae normal.     Pupils: Pupils are equal, round, and reactive to light.  Cardiovascular:     Rate and Rhythm: Normal rate and regular rhythm.  Pulmonary:     Effort: Pulmonary effort is normal.  Abdominal:     Palpations: Abdomen is soft.  Musculoskeletal:       Hands:     Cervical back: Normal range of motion and neck supple.  Skin:    General: Skin is warm and dry.  Neurological:     Mental Status: He is alert and oriented to person, place, and time.     Cranial Nerves: No cranial nerve deficit.     Motor: No abnormal muscle tone.     Coordination: Coordination normal.     Deep Tendon Reflexes: Reflexes are normal and symmetric. Reflexes normal.  Psychiatric:        Behavior: Behavior normal.        Thought Content: Thought content normal.        Judgment: Judgment normal.  X-rays were done of the left wrist, reported separately.  X-rays show extra calcification ulnar side of wrist carpal area.  MRI is requested.      Assessment & Plan:   Encounter Diagnoses  Name Primary?   Chronic pain of left wrist Yes   Tendinitis of left wrist    Get MRI of the left wrist.  I will call in prednisone dose pack.  Cock-up splint given.  Return in one month.  Earlier if MRI can be done earlier.  Call if any problem.  Precautions discussed.  Electronically Signed Darreld Mclean, MD 6/14/20223:25 PM

## 2020-06-19 DIAGNOSIS — F1120 Opioid dependence, uncomplicated: Secondary | ICD-10-CM | POA: Diagnosis not present

## 2020-06-21 ENCOUNTER — Other Ambulatory Visit: Payer: Self-pay

## 2020-06-21 ENCOUNTER — Ambulatory Visit
Admission: EM | Admit: 2020-06-21 | Discharge: 2020-06-21 | Disposition: A | Discharge status: Home / Self Care | Payer: BC Managed Care – PPO | Attending: Family Medicine | Admitting: Family Medicine

## 2020-06-21 ENCOUNTER — Encounter: Payer: Self-pay | Admitting: Emergency Medicine

## 2020-06-21 DIAGNOSIS — J209 Acute bronchitis, unspecified: Secondary | ICD-10-CM | POA: Diagnosis not present

## 2020-06-21 DIAGNOSIS — R059 Cough, unspecified: Secondary | ICD-10-CM

## 2020-06-21 MED ORDER — DOXYCYCLINE HYCLATE 100 MG PO CAPS: 100.0000 mg | ORAL_CAPSULE | Freq: Two times a day (BID) | ORAL | 0 refills | Status: AC

## 2020-06-21 MED ORDER — PROMETHAZINE-DM 6.25-15 MG/5ML PO SYRP: 2.5000 mL | ORAL_SOLUTION | Freq: Three times a day (TID) | ORAL | 0 refills | Status: DC | PRN

## 2020-06-21 MED ORDER — PREDNISONE 20 MG PO TABS: 20.0000 mg | ORAL_TABLET | Freq: Every day | ORAL | 0 refills | Status: AC

## 2020-06-21 NOTE — ED Triage Notes (Signed)
Cough x 2 weeks and just started having sore throat x 4 days  ago.

## 2020-06-21 NOTE — ED Provider Notes (Signed)
[facility]    CSN: [id number] Arrival date & time: 06/21/20  9381      History   Chief Complaint Chief Complaint  Patient presents with   Sore Throat    HPI Daniel Hays is a 40 y.o. male.   HPI Patient presents today with cough and shortness of breath gradually worsening over the last 2 weeks. He developed soreness of throat over the last 4 days which remains persistent. Denies fever. He is a chronic smoker and endorses recurrent episodes of bronchitis.  Taking otc medication without relief. No known sick exposure. Past Medical History:  Diagnosis Date   Chronic knee pain    Headache(784.0)     Patient Active Problem List   Diagnosis Date Noted   Bursitis, knee     Past Surgical History:  Procedure Laterality Date   APPENDECTOMY     COLONOSCOPY     Hx: of   KNEE BURSECTOMY Left 08/21/2015   Procedure: KNEE BURSECTOMY;  Surgeon: Vickki Hearing, MD;  Location: [facility];  Service: Orthopedics;  Laterality: Left;  PREPATELLAR   ORIF WRIST FRACTURE Right 10/13/2012   Procedure: OPEN REDUCTION INTERNAL FIXATION (ORIF)RIGHT 5TH CARPAL METACARPAL FRACTURE HAND  ;  Surgeon: Dominica Severin, MD;  Location: [facility];  Service: Orthopedics;  Laterality: Right;       Home Medications    Prior to Admission medications   Medication Sig Start Date End Date Taking? Authorizing Provider  APO-VARENICLINE 0.5 MG tablet Take 0.5 mg by mouth in the morning and at bedtime. 01/30/20   [provider]  Aspirin-Caffeine 845-65 MG PACK Take 1 packet by mouth as needed.    [provider]  busPIRone (BUSPAR) 5 MG tablet Take 5 mg by mouth 2 (two) times daily. 05/01/20   [provider]  ondansetron (ZOFRAN-ODT) 8 MG disintegrating tablet Take 8 mg by mouth every 8 (eight) hours as needed for nausea or vomiting.    [provider]  pantoprazole (PROTONIX) 40 MG tablet Take 40 mg by mouth daily.    [provider]  predniSONE (STERAPRED  UNI-PAK 21 TAB) 10 MG (21) TBPK tablet Take six pills the first day;5 pills the next day;4 pills the next day;3 pills the next day; 2 pills the next day,one the final day. 06/16/20   Darreld Mclean, MD  dicyclomine (BENTYL) 20 MG tablet Take 1 tablet (20 mg total) by mouth 2 (two) times daily. 07/21/16 02/10/19  Long, Arlyss Repress, MD    Family History Family History  Problem Relation Age of Onset   Cancer - Other Other     Social History Social History   Tobacco Use   Smoking status: Every Day    Packs/day: 1.00    Years: 16.00    Pack years: 16.00    Types: Cigarettes   Smokeless tobacco: Never  Substance Use Topics   Alcohol use: Not Currently   Drug use: Not Currently     Allergies   Avelox [moxifloxacin hcl in nacl]   Review of Systems Review of Systems Pertinent negatives listed in HPI   Physical Exam Triage Vital Signs ED Triage Vitals  Enc Vitals Group     BP 06/21/20 0855 (!) 151/92     Pulse Rate 06/21/20 0855 (!) 49     Resp 06/21/20 0855 18     Temp 06/21/20 0855 97.8 F (36.6 C)     Temp Source 06/21/20 0855 Tympanic     SpO2 06/21/20 0855 96 %  Weight --      Height --      Head Circumference --      Peak Flow --      Pain Score 06/21/20 0903 8     Pain Loc --      Pain Edu? --      Excl. in GC? --    No data found.  Updated Vital Signs BP (!) 151/92 (BP Location: Right Arm)   Pulse (!) 49   Temp 97.8 F (36.6 C) (Tympanic)   Resp 18   SpO2 96%   Visual Acuity Right Eye Distance:   Left Eye Distance:   Bilateral Distance:    Right Eye Near:   Left Eye Near:    Bilateral Near:     Physical Exam  General appearance: alert, Ill-appearing, no distress Head: Normocephalic, without obvious abnormality, atraumatic ENT: Ears normal, nares with mucosal edema, congestion, mildly erythematous oropharynx w/o exudate Respiratory: Respirations even , unlabored, coarse lung sound, fine expiratory wheeze Heart: Bradycardia and rhythm normal. No  gallop or murmurs noted on exam  Extremities: No gross deformities Skin: Skin color, texture, turgor normal. No rashes seen  Psych: Appropriate mood and affect. Neurologic: GCS 15 normal coordination normal gait  UC Treatments / Results  Labs (all labs ordered are listed, but only abnormal results are displayed) Labs Reviewed - No data to display  EKG   Radiology No results found.  Procedures Procedures (including critical care time)  Medications Ordered in UC Medications - No data to display  Initial Impression / Assessment and Plan / UC Course  I have reviewed the triage vital signs and the nursing notes.  Pertinent labs & imaging results that were available during my care of the patient were reviewed by me and considered in my medical decision making (see chart for details).    Acute bronchitis with cough.  Treatment per discharge instructions.  Patient encouraged to discontinue smoking reports that he is making efforts to quit. Return precautions given.  Patient verbalized understanding agreement with plan. Final Clinical Impressions(s) / UC Diagnoses   Final diagnoses:  Acute bronchitis, unspecified organism  Cough   Discharge Instructions   None    ED Prescriptions     Medication Sig Dispense Auth. Provider   predniSONE (DELTASONE) 20 MG tablet Take 1 tablet (20 mg total) by mouth daily with breakfast for 5 days. 5 tablet Bing Neighbors, FNP   promethazine-dextromethorphan (PROMETHAZINE-DM) 6.25-15 MG/5ML syrup Take 2.5 mLs by mouth 3 (three) times daily as needed for cough. 118 mL Bing Neighbors, FNP   doxycycline (VIBRAMYCIN) 100 MG capsule Take 1 capsule (100 mg total) by mouth 2 (two) times daily for 7 days. 14 capsule Bing Neighbors, FNP      PDMP not reviewed this encounter.   Bing Neighbors, FNP 06/21/20 1019

## 2020-06-26 DIAGNOSIS — F1120 Opioid dependence, uncomplicated: Secondary | ICD-10-CM | POA: Diagnosis not present

## 2020-07-03 DIAGNOSIS — F1120 Opioid dependence, uncomplicated: Secondary | ICD-10-CM | POA: Diagnosis not present

## 2020-07-10 DIAGNOSIS — F1120 Opioid dependence, uncomplicated: Secondary | ICD-10-CM | POA: Diagnosis not present

## 2020-07-11 DIAGNOSIS — F1120 Opioid dependence, uncomplicated: Secondary | ICD-10-CM | POA: Diagnosis not present

## 2020-07-13 DIAGNOSIS — F1120 Opioid dependence, uncomplicated: Secondary | ICD-10-CM | POA: Diagnosis not present

## 2020-07-14 ENCOUNTER — Ambulatory Visit: Payer: BC Managed Care – PPO | Admitting: Orthopaedic Surgery

## 2020-07-14 DIAGNOSIS — F1120 Opioid dependence, uncomplicated: Secondary | ICD-10-CM | POA: Diagnosis not present

## 2020-07-15 DIAGNOSIS — F1120 Opioid dependence, uncomplicated: Secondary | ICD-10-CM | POA: Diagnosis not present

## 2020-07-16 ENCOUNTER — Other Ambulatory Visit: Payer: Self-pay

## 2020-07-16 ENCOUNTER — Ambulatory Visit (HOSPITAL_COMMUNITY)
Admission: RE | Admit: 2020-07-16 | Discharge: 2020-07-16 | Disposition: A | Discharge status: Home / Self Care | Payer: BC Managed Care – PPO | Source: Ambulatory Visit | Attending: Orthopaedic Surgery | Admitting: Orthopaedic Surgery

## 2020-07-16 DIAGNOSIS — G8929 Other chronic pain: Secondary | ICD-10-CM | POA: Diagnosis not present

## 2020-07-16 DIAGNOSIS — M25532 Pain in left wrist: Secondary | ICD-10-CM | POA: Diagnosis not present

## 2020-07-16 DIAGNOSIS — M778 Other enthesopathies, not elsewhere classified: Secondary | ICD-10-CM | POA: Diagnosis not present

## 2020-07-16 DIAGNOSIS — R600 Localized edema: Secondary | ICD-10-CM | POA: Diagnosis not present

## 2020-07-16 DIAGNOSIS — M19032 Primary osteoarthritis, left wrist: Secondary | ICD-10-CM | POA: Diagnosis not present

## 2020-07-16 DIAGNOSIS — F1120 Opioid dependence, uncomplicated: Secondary | ICD-10-CM | POA: Diagnosis not present

## 2020-07-16 DIAGNOSIS — M25432 Effusion, left wrist: Secondary | ICD-10-CM | POA: Diagnosis not present

## 2020-07-17 DIAGNOSIS — F1120 Opioid dependence, uncomplicated: Secondary | ICD-10-CM | POA: Diagnosis not present

## 2020-07-18 DIAGNOSIS — F1120 Opioid dependence, uncomplicated: Secondary | ICD-10-CM | POA: Diagnosis not present

## 2020-07-20 DIAGNOSIS — F1120 Opioid dependence, uncomplicated: Secondary | ICD-10-CM | POA: Diagnosis not present

## 2020-07-21 ENCOUNTER — Encounter: Payer: Self-pay | Admitting: Orthopaedic Surgery

## 2020-07-21 ENCOUNTER — Other Ambulatory Visit: Payer: Self-pay

## 2020-07-21 ENCOUNTER — Ambulatory Visit: Payer: BC Managed Care – PPO | Admitting: Orthopaedic Surgery

## 2020-07-21 VITALS — Ht 72.0 in | Wt 207.0 lb

## 2020-07-21 DIAGNOSIS — M778 Other enthesopathies, not elsewhere classified: Secondary | ICD-10-CM

## 2020-07-21 DIAGNOSIS — G8929 Other chronic pain: Secondary | ICD-10-CM

## 2020-07-21 DIAGNOSIS — F1120 Opioid dependence, uncomplicated: Secondary | ICD-10-CM | POA: Diagnosis not present

## 2020-07-21 DIAGNOSIS — M25532 Pain in left wrist: Secondary | ICD-10-CM | POA: Diagnosis not present

## 2020-07-21 MED ORDER — NAPROXEN 500 MG PO TABS: 500.0000 mg | ORAL_TABLET | Freq: Two times a day (BID) | ORAL | 5 refills | Status: DC

## 2020-07-21 NOTE — Progress Notes (Signed)
My wrist has some swelling.  He had MRI of the left wrist and it showed:  IMPRESSION: 1. Age advanced degenerative changes as detailed above. Posttraumatic arthritis versus possible inflammatory arthropathy. 2. No acute fracture or bone lesion. 3. Slight widening of the scapholunate joint space with a small amount of fluid but no obvious scapholunate ligament tear. There is also fluid in the radioulnar joint but no obvious TFCC tear. 4. Carpal tunnel fluid and suspected synovitis.   I have independently reviewed the MRI.     I have explained the findings to him. I will begin Naprosyn 500 po bid pc.  I do not recommend any surgery.  He will use the cock-up splint.  Also get Voltaren Gel and use that tid.  Encounter Diagnoses  Name Primary?   Chronic pain of left wrist Yes   Tendinitis of left wrist    He does have volar swelling today but ROM is full and tender.  NV intact.  Return in one month.  Call if any problem.  Precautions discussed.  Electronically Signed Darreld Mclean, MD 7/19/20223:53 PM

## 2020-07-22 DIAGNOSIS — F1120 Opioid dependence, uncomplicated: Secondary | ICD-10-CM | POA: Diagnosis not present

## 2020-07-23 DIAGNOSIS — F1120 Opioid dependence, uncomplicated: Secondary | ICD-10-CM | POA: Diagnosis not present

## 2020-07-24 DIAGNOSIS — F1120 Opioid dependence, uncomplicated: Secondary | ICD-10-CM | POA: Diagnosis not present

## 2020-07-25 DIAGNOSIS — F1120 Opioid dependence, uncomplicated: Secondary | ICD-10-CM | POA: Diagnosis not present

## 2020-07-27 DIAGNOSIS — F1120 Opioid dependence, uncomplicated: Secondary | ICD-10-CM | POA: Diagnosis not present

## 2020-07-28 DIAGNOSIS — F1120 Opioid dependence, uncomplicated: Secondary | ICD-10-CM | POA: Diagnosis not present

## 2020-07-29 DIAGNOSIS — F1120 Opioid dependence, uncomplicated: Secondary | ICD-10-CM | POA: Diagnosis not present

## 2020-07-30 DIAGNOSIS — F1120 Opioid dependence, uncomplicated: Secondary | ICD-10-CM | POA: Diagnosis not present

## 2020-07-31 DIAGNOSIS — F1120 Opioid dependence, uncomplicated: Secondary | ICD-10-CM | POA: Diagnosis not present

## 2020-08-01 DIAGNOSIS — F1120 Opioid dependence, uncomplicated: Secondary | ICD-10-CM | POA: Diagnosis not present

## 2020-08-03 DIAGNOSIS — F1120 Opioid dependence, uncomplicated: Secondary | ICD-10-CM | POA: Diagnosis not present

## 2020-08-04 DIAGNOSIS — F1120 Opioid dependence, uncomplicated: Secondary | ICD-10-CM | POA: Diagnosis not present

## 2020-08-05 DIAGNOSIS — F1120 Opioid dependence, uncomplicated: Secondary | ICD-10-CM | POA: Diagnosis not present

## 2020-08-06 DIAGNOSIS — F1120 Opioid dependence, uncomplicated: Secondary | ICD-10-CM | POA: Diagnosis not present

## 2020-08-07 DIAGNOSIS — F1120 Opioid dependence, uncomplicated: Secondary | ICD-10-CM | POA: Diagnosis not present

## 2020-08-08 DIAGNOSIS — F1120 Opioid dependence, uncomplicated: Secondary | ICD-10-CM | POA: Diagnosis not present

## 2020-08-10 DIAGNOSIS — F1120 Opioid dependence, uncomplicated: Secondary | ICD-10-CM | POA: Diagnosis not present

## 2020-08-11 DIAGNOSIS — F1120 Opioid dependence, uncomplicated: Secondary | ICD-10-CM | POA: Diagnosis not present

## 2020-08-12 DIAGNOSIS — F1120 Opioid dependence, uncomplicated: Secondary | ICD-10-CM | POA: Diagnosis not present

## 2020-08-13 DIAGNOSIS — F1120 Opioid dependence, uncomplicated: Secondary | ICD-10-CM | POA: Diagnosis not present

## 2020-08-14 DIAGNOSIS — F1120 Opioid dependence, uncomplicated: Secondary | ICD-10-CM | POA: Diagnosis not present

## 2020-08-18 ENCOUNTER — Ambulatory Visit: Payer: BC Managed Care – PPO | Admitting: Orthopaedic Surgery

## 2020-08-18 DIAGNOSIS — F1120 Opioid dependence, uncomplicated: Secondary | ICD-10-CM | POA: Diagnosis not present

## 2020-08-19 DIAGNOSIS — F1120 Opioid dependence, uncomplicated: Secondary | ICD-10-CM | POA: Diagnosis not present

## 2020-08-20 DIAGNOSIS — F1120 Opioid dependence, uncomplicated: Secondary | ICD-10-CM | POA: Diagnosis not present

## 2020-08-21 DIAGNOSIS — F1120 Opioid dependence, uncomplicated: Secondary | ICD-10-CM | POA: Diagnosis not present

## 2020-08-25 DIAGNOSIS — F1120 Opioid dependence, uncomplicated: Secondary | ICD-10-CM | POA: Diagnosis not present

## 2020-08-26 DIAGNOSIS — F1120 Opioid dependence, uncomplicated: Secondary | ICD-10-CM | POA: Diagnosis not present

## 2020-08-27 DIAGNOSIS — F1120 Opioid dependence, uncomplicated: Secondary | ICD-10-CM | POA: Diagnosis not present

## 2020-08-28 DIAGNOSIS — F1120 Opioid dependence, uncomplicated: Secondary | ICD-10-CM | POA: Diagnosis not present

## 2020-09-01 DIAGNOSIS — F1120 Opioid dependence, uncomplicated: Secondary | ICD-10-CM | POA: Diagnosis not present

## 2020-09-08 DIAGNOSIS — F1120 Opioid dependence, uncomplicated: Secondary | ICD-10-CM | POA: Diagnosis not present

## 2020-09-10 DIAGNOSIS — M79601 Pain in right arm: Secondary | ICD-10-CM | POA: Diagnosis not present

## 2020-09-13 DIAGNOSIS — F1120 Opioid dependence, uncomplicated: Secondary | ICD-10-CM | POA: Diagnosis not present

## 2020-09-15 DIAGNOSIS — F1120 Opioid dependence, uncomplicated: Secondary | ICD-10-CM | POA: Diagnosis not present

## 2020-09-22 DIAGNOSIS — F1120 Opioid dependence, uncomplicated: Secondary | ICD-10-CM | POA: Diagnosis not present

## 2020-09-29 DIAGNOSIS — F1120 Opioid dependence, uncomplicated: Secondary | ICD-10-CM | POA: Diagnosis not present

## 2020-10-01 ENCOUNTER — Other Ambulatory Visit (HOSPITAL_COMMUNITY): Payer: Self-pay | Admitting: Neurology

## 2020-10-01 DIAGNOSIS — F419 Anxiety disorder, unspecified: Secondary | ICD-10-CM | POA: Diagnosis not present

## 2020-10-01 DIAGNOSIS — M792 Neuralgia and neuritis, unspecified: Secondary | ICD-10-CM

## 2020-10-01 DIAGNOSIS — K219 Gastro-esophageal reflux disease without esophagitis: Secondary | ICD-10-CM | POA: Diagnosis not present

## 2020-10-06 DIAGNOSIS — F1120 Opioid dependence, uncomplicated: Secondary | ICD-10-CM | POA: Diagnosis not present

## 2020-10-07 DIAGNOSIS — F1120 Opioid dependence, uncomplicated: Secondary | ICD-10-CM | POA: Diagnosis not present

## 2020-10-13 DIAGNOSIS — F1120 Opioid dependence, uncomplicated: Secondary | ICD-10-CM | POA: Diagnosis not present

## 2020-10-20 DIAGNOSIS — F1120 Opioid dependence, uncomplicated: Secondary | ICD-10-CM | POA: Diagnosis not present

## 2020-10-27 DIAGNOSIS — F1120 Opioid dependence, uncomplicated: Secondary | ICD-10-CM | POA: Diagnosis not present

## 2020-11-03 DIAGNOSIS — F1120 Opioid dependence, uncomplicated: Secondary | ICD-10-CM | POA: Diagnosis not present

## 2020-11-10 DIAGNOSIS — F1120 Opioid dependence, uncomplicated: Secondary | ICD-10-CM | POA: Diagnosis not present

## 2020-11-17 DIAGNOSIS — F1120 Opioid dependence, uncomplicated: Secondary | ICD-10-CM | POA: Diagnosis not present

## 2020-11-24 DIAGNOSIS — F1120 Opioid dependence, uncomplicated: Secondary | ICD-10-CM | POA: Diagnosis not present

## 2020-12-01 DIAGNOSIS — F1120 Opioid dependence, uncomplicated: Secondary | ICD-10-CM | POA: Diagnosis not present

## 2020-12-08 DIAGNOSIS — F1120 Opioid dependence, uncomplicated: Secondary | ICD-10-CM | POA: Diagnosis not present

## 2020-12-09 DIAGNOSIS — F1120 Opioid dependence, uncomplicated: Secondary | ICD-10-CM | POA: Diagnosis not present

## 2020-12-15 DIAGNOSIS — F1120 Opioid dependence, uncomplicated: Secondary | ICD-10-CM | POA: Diagnosis not present

## 2020-12-22 DIAGNOSIS — F1120 Opioid dependence, uncomplicated: Secondary | ICD-10-CM | POA: Diagnosis not present

## 2020-12-29 DIAGNOSIS — F1120 Opioid dependence, uncomplicated: Secondary | ICD-10-CM | POA: Diagnosis not present

## 2021-01-03 DIAGNOSIS — F1120 Opioid dependence, uncomplicated: Secondary | ICD-10-CM | POA: Diagnosis not present

## 2021-01-05 DIAGNOSIS — F1120 Opioid dependence, uncomplicated: Secondary | ICD-10-CM | POA: Diagnosis not present

## 2021-01-08 DIAGNOSIS — F1120 Opioid dependence, uncomplicated: Secondary | ICD-10-CM | POA: Diagnosis not present

## 2021-01-12 DIAGNOSIS — F1120 Opioid dependence, uncomplicated: Secondary | ICD-10-CM | POA: Diagnosis not present

## 2021-01-19 DIAGNOSIS — F1120 Opioid dependence, uncomplicated: Secondary | ICD-10-CM | POA: Diagnosis not present

## 2021-01-26 DIAGNOSIS — F1120 Opioid dependence, uncomplicated: Secondary | ICD-10-CM | POA: Diagnosis not present

## 2021-02-02 DIAGNOSIS — F1120 Opioid dependence, uncomplicated: Secondary | ICD-10-CM | POA: Diagnosis not present

## 2021-02-04 DIAGNOSIS — F1120 Opioid dependence, uncomplicated: Secondary | ICD-10-CM | POA: Diagnosis not present

## 2021-02-09 DIAGNOSIS — F1120 Opioid dependence, uncomplicated: Secondary | ICD-10-CM | POA: Diagnosis not present

## 2021-02-16 DIAGNOSIS — F1120 Opioid dependence, uncomplicated: Secondary | ICD-10-CM | POA: Diagnosis not present

## 2021-02-17 DIAGNOSIS — F1120 Opioid dependence, uncomplicated: Secondary | ICD-10-CM | POA: Diagnosis not present

## 2021-02-23 DIAGNOSIS — F1120 Opioid dependence, uncomplicated: Secondary | ICD-10-CM | POA: Diagnosis not present

## 2021-03-02 DIAGNOSIS — F1120 Opioid dependence, uncomplicated: Secondary | ICD-10-CM | POA: Diagnosis not present

## 2021-03-03 DIAGNOSIS — F1120 Opioid dependence, uncomplicated: Secondary | ICD-10-CM | POA: Diagnosis not present

## 2021-03-09 DIAGNOSIS — F1120 Opioid dependence, uncomplicated: Secondary | ICD-10-CM | POA: Diagnosis not present

## 2021-03-15 DIAGNOSIS — Z5181 Encounter for therapeutic drug level monitoring: Secondary | ICD-10-CM | POA: Diagnosis not present

## 2021-03-15 DIAGNOSIS — Z79899 Other long term (current) drug therapy: Secondary | ICD-10-CM | POA: Diagnosis not present

## 2021-03-15 DIAGNOSIS — F1120 Opioid dependence, uncomplicated: Secondary | ICD-10-CM | POA: Diagnosis not present

## 2021-03-16 DIAGNOSIS — F1120 Opioid dependence, uncomplicated: Secondary | ICD-10-CM | POA: Diagnosis not present

## 2021-03-17 DIAGNOSIS — F1120 Opioid dependence, uncomplicated: Secondary | ICD-10-CM | POA: Diagnosis not present

## 2021-03-19 DIAGNOSIS — F1120 Opioid dependence, uncomplicated: Secondary | ICD-10-CM | POA: Diagnosis not present

## 2021-03-23 DIAGNOSIS — F1120 Opioid dependence, uncomplicated: Secondary | ICD-10-CM | POA: Diagnosis not present

## 2021-03-30 DIAGNOSIS — F1120 Opioid dependence, uncomplicated: Secondary | ICD-10-CM | POA: Diagnosis not present

## 2021-03-31 DIAGNOSIS — F1120 Opioid dependence, uncomplicated: Secondary | ICD-10-CM | POA: Diagnosis not present

## 2021-04-03 DIAGNOSIS — R110 Nausea: Secondary | ICD-10-CM | POA: Diagnosis not present

## 2021-04-03 DIAGNOSIS — R202 Paresthesia of skin: Secondary | ICD-10-CM | POA: Diagnosis not present

## 2021-04-06 DIAGNOSIS — F1120 Opioid dependence, uncomplicated: Secondary | ICD-10-CM | POA: Diagnosis not present

## 2021-04-13 DIAGNOSIS — F1120 Opioid dependence, uncomplicated: Secondary | ICD-10-CM | POA: Diagnosis not present

## 2021-04-13 DIAGNOSIS — Z5181 Encounter for therapeutic drug level monitoring: Secondary | ICD-10-CM | POA: Diagnosis not present

## 2021-04-13 DIAGNOSIS — Z79899 Other long term (current) drug therapy: Secondary | ICD-10-CM | POA: Diagnosis not present

## 2021-04-14 DIAGNOSIS — F1120 Opioid dependence, uncomplicated: Secondary | ICD-10-CM | POA: Diagnosis not present

## 2021-04-19 ENCOUNTER — Encounter (INDEPENDENT_AMBULATORY_CARE_PROVIDER_SITE_OTHER): Payer: Self-pay | Admitting: *Deleted

## 2021-04-20 DIAGNOSIS — F1120 Opioid dependence, uncomplicated: Secondary | ICD-10-CM | POA: Diagnosis not present

## 2021-04-27 DIAGNOSIS — F1120 Opioid dependence, uncomplicated: Secondary | ICD-10-CM | POA: Diagnosis not present

## 2021-04-28 DIAGNOSIS — F1120 Opioid dependence, uncomplicated: Secondary | ICD-10-CM | POA: Diagnosis not present

## 2021-05-04 DIAGNOSIS — F1120 Opioid dependence, uncomplicated: Secondary | ICD-10-CM | POA: Diagnosis not present

## 2021-05-11 DIAGNOSIS — F1120 Opioid dependence, uncomplicated: Secondary | ICD-10-CM | POA: Diagnosis not present

## 2021-05-12 DIAGNOSIS — F1120 Opioid dependence, uncomplicated: Secondary | ICD-10-CM | POA: Diagnosis not present

## 2021-05-14 ENCOUNTER — Encounter: Payer: Self-pay | Admitting: Neurology

## 2021-05-14 ENCOUNTER — Ambulatory Visit (INDEPENDENT_AMBULATORY_CARE_PROVIDER_SITE_OTHER): Payer: BC Managed Care – PPO | Admitting: Neurology

## 2021-05-14 VITALS — BP 146/84 | HR 68 | Ht 72.0 in | Wt 215.0 lb

## 2021-05-14 DIAGNOSIS — M5412 Radiculopathy, cervical region: Secondary | ICD-10-CM

## 2021-05-14 DIAGNOSIS — R519 Headache, unspecified: Secondary | ICD-10-CM

## 2021-05-14 MED ORDER — DULOXETINE HCL 60 MG PO CPEP: 60.0000 mg | ORAL_CAPSULE | Freq: Every day | ORAL | 11 refills | Status: DC

## 2021-05-14 NOTE — Progress Notes (Signed)
? ?Chief Complaint  ?Patient presents with  ? RM 14  ?  Patient is here alone for evaluation of his R arm numbness, tingling, and weakness. He states he is affected from his neck to his fingers. Started almost a year ago. Within the last 3-4 months it has gotten extreme. He has pain about 2/10 but on an "uncomfortable scale and a problem" it's a 12/10.  ? ? ? ? ?ASSESSMENT AND PLAN ? ?Daniel Hays is a 41 y.o. male   ?Right cervical radiculopathy ? MRI of cervical spine ? Add on Cymbalta 60 mg daily ? ?Obstructive sleep apnea ? Frequent awakening at nighttime, early morning headaches, excessive daytime sleepiness, fatigue, narrow oropharyngeal space ? Referred for sleep study ? ? ?DIAGNOSTIC DATA (LABS, IMAGING, TESTING) ?- I reviewed patient records, labs, notes, testing and imaging myself where available. ? ? ?MEDICAL HISTORY: ? ?Daniel Hays, is a 41 year old male, seen in request by his primary care physician Dr. Margo Aye, Kathleene Hazel, for evaluation of right neck pain, radiating pain to right upper extremity ? ?I reviewed and summarized the referring note. PMHX. ?Depression, anxiety ?GERD ? ?He reported gradual onset of right neck pain, radiating pain to right shoulder and upper extremity, getting worse since October 2022, notes he had persistent right arm discomfort, ongoing deep achy pain, mild subjective weakness of right hand ? ?He denies left lower extremity involvement, denies gait abnormality, no bowel or bladder incontinence ? ?He works as a Psychologist, sport and exercise, constant neck pain caused a lot of difficulty, ? ?He also complains of excessive stress at home, he is divorced with his wife, is a single father raising his 62 year old son, had a history of narcotic abuse, is at methadone clinic since 2019, taking methadone 90 mg daily ? ?He complains of frequent awakening from sleep,, times each night, dry mouth, early morning headaches, taking daily BC powder for over 5 years, with GI symptoms, ? ? ?PHYSICAL  EXAM: ?  ?Vitals:  ? 05/14/21 0856  ?BP: (!) 146/84  ?Pulse: 68  ?Weight: 215 lb (97.5 kg)  ?Height: 6' (1.829 m)  ? ?Not recorded ?  ? ? ?Body mass index is 29.16 kg/m?. ? ?PHYSICAL EXAMNIATION: ? ?Gen: NAD, conversant, well nourised, well groomed                     ?Cardiovascular: Regular rate rhythm, no peripheral edema, warm, nontender. ?Eyes: Conjunctivae clear without exudates or hemorrhage ?Neck: Supple, no carotid bruits. ?Pulmonary: Clear to auscultation bilaterally  ? ?NEUROLOGICAL EXAM: ? ?MENTAL STATUS: ?Speech/cognition: ?Anxious looking middle-age male, ? ?  ?CRANIAL NERVES: ?CN II: Visual fields are full to confrontation. Pupils are round equal and briskly reactive to light. ?CN III, IV, VI: extraocular movement are normal. No ptosis. ?CN V: Facial sensation is intact to light touch ?CN VII: Face is symmetric with normal eye closure  ?CN VIII: Hearing is normal to causal conversation. ?CN IX, X: Phonation is normal. ?CN XI: Head turning and shoulder shrug are intact ?CN XII: Narrow long pharyngeal space ? ?MOTOR: Mild right shoulder abduction, external rotation weakness ? ?REFLEXES: ?Reflexes are 2  and symmetric at the biceps, triceps, knees, and ankles. Plantar responses are flexor. ? ?SENSORY: ?Intact to light touch, pinprick and vibratory sensation are intact in fingers and toes. ? ?COORDINATION: ?There is no trunk or limb dysmetria noted. ? ?GAIT/STANCE: ?Posture is normal. Gait is steady with normal steps, base, arm swing, and turning. Heel and toe walking are normal.  Tandem gait is normal.  ?Romberg is absent. ? ?REVIEW OF SYSTEMS:  ?Full 14 system review of systems performed and notable only for as above ?All other review of systems were negative. ? ? ?ALLERGIES: ?Allergies  ?Allergen Reactions  ? Avelox [Moxifloxacin Hcl In Nacl]   ?  Hives,cold sweats  ? ? ?HOME MEDICATIONS: ?Current Outpatient Medications  ?Medication Sig Dispense Refill  ? Aspirin-Salicylamide-Caffeine (BC HEADACHE  POWDER PO) Take by mouth daily as needed.    ? busPIRone (BUSPAR) 5 MG tablet Take 5 mg by mouth 2 (two) times daily.    ? naproxen (NAPROSYN) 500 MG tablet Take 1 tablet (500 mg total) by mouth 2 (two) times daily with a meal. 60 tablet 5  ? ondansetron (ZOFRAN-ODT) 8 MG disintegrating tablet Take 8 mg by mouth every 8 (eight) hours as needed for nausea or vomiting.    ? pantoprazole (PROTONIX) 40 MG tablet Take 40 mg by mouth daily.    ? UNABLE TO FIND Med Name: Methadone liquid    ? APO-VARENICLINE 0.5 MG tablet Take 0.5 mg by mouth in the morning and at bedtime. (Patient not taking: Reported on 05/14/2021)    ? ?No current facility-administered medications for this visit.  ? ? ?PAST MEDICAL HISTORY: ?Past Medical History:  ?Diagnosis Date  ? Chronic knee pain   ? Headache(784.0)   ? ? ?PAST SURGICAL HISTORY: ?Past Surgical History:  ?Procedure Laterality Date  ? APPENDECTOMY    ? COLONOSCOPY    ? Hx: of  ? KNEE BURSECTOMY Left 08/21/2015  ? Procedure: KNEE BURSECTOMY;  Surgeon: Vickki Hearing, MD;  Location: [facility];  Service: Orthopedics;  Laterality: Left;  PREPATELLAR  ? ORIF WRIST FRACTURE Right 10/13/2012  ? Procedure: OPEN REDUCTION INTERNAL FIXATION (ORIF)RIGHT 5TH CARPAL METACARPAL FRACTURE HAND  ;  Surgeon: Dominica Severin, MD;  Location: [facility];  Service: Orthopedics;  Laterality: Right;  ? ? ?FAMILY HISTORY: ?Family History  ?Problem Relation Age of Onset  ? Cancer - Other Other   ? ? ?SOCIAL HISTORY: ?Social History  ? ?Socioeconomic History  ? Marital status: Single  ?  Spouse name: Not on file  ? Number of children: Not on file  ? Years of education: Not on file  ? Highest education level: Not on file  ?Occupational History  ? Not on file  ?Tobacco Use  ? Smoking status: Every Day  ?  Packs/day: 1.00  ?  Years: 16.00  ?  Pack years: 16.00  ?  Types: Cigarettes  ? Smokeless tobacco: Never  ?Substance and Sexual Activity  ? Alcohol use: Not Currently  ? Drug use: Not Currently  ?  Comment: h/o drug  problem, on Methadone now  ? Sexual activity: Yes  ?  Birth control/protection: None  ?Other Topics Concern  ? Not on file  ?Social History Narrative  ? Lives with son 86 y.o.  ? Right handed  ? Caffeine: coffee 2-3 cups/day  ? ?Social Determinants of Health  ? ?Financial Resource Strain: Not on file  ?Food Insecurity: Not on file  ?Transportation Needs: Not on file  ?Physical Activity: Not on file  ?Stress: Not on file  ?Social Connections: Not on file  ?Intimate Partner Violence: Not on file  ? ? ? ? ?Levert Feinstein, M.D. Ph.D. ? ?[facility] ?[address], [address] ?[place], [place] [address] ?Ph: [phone] ?Fax: [phone] ? ?CC:  Leone Payor, FNP ?[address] ?[address] ?[place],  [place] [address]  Benita Stabile,  MD   ?

## 2021-05-18 DIAGNOSIS — F1120 Opioid dependence, uncomplicated: Secondary | ICD-10-CM | POA: Diagnosis not present

## 2021-05-19 ENCOUNTER — Telehealth: Payer: Self-pay | Admitting: Neurology

## 2021-05-19 NOTE — Telephone Encounter (Signed)
30 mins MRI Cervical spine wo contrast Dr. Willette Pa Josem Kaufmann: [id number] exp. 05/17/21-06/15/21 scheduled at [facility] 05/25/21 at 9am ?

## 2021-05-25 ENCOUNTER — Ambulatory Visit: Payer: BC Managed Care – PPO

## 2021-05-25 DIAGNOSIS — F1120 Opioid dependence, uncomplicated: Secondary | ICD-10-CM | POA: Diagnosis not present

## 2021-05-25 DIAGNOSIS — M5412 Radiculopathy, cervical region: Secondary | ICD-10-CM | POA: Diagnosis not present

## 2021-05-25 DIAGNOSIS — R519 Headache, unspecified: Secondary | ICD-10-CM

## 2021-05-25 DIAGNOSIS — Z79899 Other long term (current) drug therapy: Secondary | ICD-10-CM | POA: Diagnosis not present

## 2021-05-25 DIAGNOSIS — Z5181 Encounter for therapeutic drug level monitoring: Secondary | ICD-10-CM | POA: Diagnosis not present

## 2021-05-26 DIAGNOSIS — F1120 Opioid dependence, uncomplicated: Secondary | ICD-10-CM | POA: Diagnosis not present

## 2021-05-27 ENCOUNTER — Telehealth: Payer: Self-pay | Admitting: Neurology

## 2021-05-27 NOTE — Telephone Encounter (Signed)
Please call patient, MRI of cervical spine showed degenerative changes, most obvious at C5-6 level, with moderate right-sided foraminal stenosis, mild canal stenosis, Please check on patient, if he still has significant neck pain after taking Cymbalta, will order EMG nerve conduction study,   IMPRESSION: MRI scan cervical spine without contrast showing prominent spondylitic changes with disc osteophyte protrusion at C5-6 resulting in moderate right-sided foraminal and mild canal and left-sided foraminal narrowing.

## 2021-05-27 NOTE — Telephone Encounter (Signed)
I left a detailed VM on the patient's answering machine (as per DPR). I provided the results and requested a call back to follow up on patient's neck pain taking Cymbalta. Our call back number was left for the patient.

## 2021-06-01 DIAGNOSIS — F1120 Opioid dependence, uncomplicated: Secondary | ICD-10-CM | POA: Diagnosis not present

## 2021-06-01 NOTE — Telephone Encounter (Signed)
I spoke to the patient and provided him with the cervical MRI results. He is still having neck pain. Not much change with duloxetine. He would like to move forward with the NCV/EMG. The test has been scheduled.

## 2021-06-08 DIAGNOSIS — Z5181 Encounter for therapeutic drug level monitoring: Secondary | ICD-10-CM | POA: Diagnosis not present

## 2021-06-08 DIAGNOSIS — F1120 Opioid dependence, uncomplicated: Secondary | ICD-10-CM | POA: Diagnosis not present

## 2021-06-08 DIAGNOSIS — Z79899 Other long term (current) drug therapy: Secondary | ICD-10-CM | POA: Diagnosis not present

## 2021-06-09 DIAGNOSIS — F1120 Opioid dependence, uncomplicated: Secondary | ICD-10-CM | POA: Diagnosis not present

## 2021-06-15 DIAGNOSIS — F1120 Opioid dependence, uncomplicated: Secondary | ICD-10-CM | POA: Diagnosis not present

## 2021-06-16 ENCOUNTER — Ambulatory Visit: Payer: BC Managed Care – PPO | Admitting: Neurology

## 2021-06-16 ENCOUNTER — Encounter: Payer: Self-pay | Admitting: Neurology

## 2021-06-16 ENCOUNTER — Ambulatory Visit (INDEPENDENT_AMBULATORY_CARE_PROVIDER_SITE_OTHER): Payer: BC Managed Care – PPO | Admitting: Neurology

## 2021-06-16 VITALS — BP 117/71 | HR 65 | Ht 72.0 in | Wt 215.0 lb

## 2021-06-16 DIAGNOSIS — M5412 Radiculopathy, cervical region: Secondary | ICD-10-CM

## 2021-06-16 MED ORDER — METHYLPREDNISOLONE 4 MG PO TBPK: ORAL_TABLET | ORAL | 0 refills | Status: DC

## 2021-06-16 MED ORDER — GABAPENTIN 300 MG PO CAPS: 300.0000 mg | ORAL_CAPSULE | Freq: Three times a day (TID) | ORAL | 11 refills | Status: DC

## 2021-06-16 MED ORDER — MELOXICAM 15 MG PO TABS: 15.0000 mg | ORAL_TABLET | Freq: Every day | ORAL | 6 refills | Status: DC | PRN

## 2021-06-16 NOTE — Progress Notes (Signed)
No chief complaint on file.     ASSESSMENT AND PLAN  KIROS BISCHOFF is a 41 y.o. male   Right cervical radiculopathy  MRI of cervical spine showed C5-6 spondylitic changes with osteophyte protrusion, moderate right mild left foraminal stenosis no spinal cord compression  EMG nerve conduction study only showed mild chronic neuropathic changes, not a surgical candidate  Continue Cymbalta 60 mg daily  Medrol pack  Mobic as needed  Gabapentin 300 mg up to 3 tablets every night to help him sleep  Obstructive sleep apnea  Referral to sleep study  DIAGNOSTIC DATA (LABS, IMAGING, TESTING) - I reviewed patient records, labs, notes, testing and imaging myself where available.   MEDICAL HISTORY:  CRISTOS YAZZIE, is a 41 year old male, seen in request by his primary care physician Dr. Nevada Crane, Edwinna Areola, for evaluation of right neck pain, radiating pain to right upper extremity  I reviewed and summarized the referring note. PMHX. Depression, anxiety GERD  He reported gradual onset of right neck pain, radiating pain to right shoulder and upper extremity, getting worse since October 2022, notes he had persistent right arm discomfort, ongoing deep achy pain, mild subjective weakness of right hand  He denies left lower extremity involvement, denies gait abnormality, no bowel or bladder incontinence  He works as a Event organiser, constant neck pain caused a lot of difficulty,  He also complains of excessive stress at home, he is divorced with his wife, is a single father raising his 17 year old son, had a history of narcotic abuse, is at methadone clinic since 2019, taking methadone 90 mg daily  He complains of frequent awakening from sleep,, times each night, dry mouth, early morning headaches, taking daily BC powder for over 5 years, with GI symptoms,  Update June 16, 2021 He return for electrodiagnostic study today, no evidence of carpal tunnel syndromes, mild right C5-6 radiculopathy,  no evidence of active process  We personally reviewed MRI of cervical spine on May 25, 2021, prominent spondylitic changes, with disc osteophyte protrusion at C5-6, with moderate right-sided foraminal and mild left-sided foraminal narrowing, no spinal cord compression  He complains suboptimal control of his constant moderate nagging right-sided neck pain with his current medications Cymbalta 60 mg daily, did help his symptoms some, he has been taking frequent BC powders for headache,  PHYSICAL EXAM:  Gen: NAD, conversant, well nourised, well groomed      NEUROLOGICAL EXAM:  MENTAL STATUS: Speech/cognition: Anxious looking middle-age male,    CRANIAL NERVES: CN II: Visual fields are full to confrontation. Pupils are round equal and briskly reactive to light. CN III, IV, VI: extraocular movement are normal. No ptosis. CN V: Facial sensation is intact to light touch CN VII: Face is symmetric with normal eye closure  CN VIII: Hearing is normal to causal conversation. CN IX, X: Phonation is normal. CN XI: Head turning and shoulder shrug are intact CN XII: Narrow long pharyngeal space  MOTOR: Mild right shoulder abduction, external rotation weakness  REFLEXES: Reflexes are 2  and symmetric at the biceps, triceps, knees, and ankles. Plantar responses are flexor.  SENSORY: Intact to light touch, pinprick and vibratory sensation are intact in fingers and toes.  COORDINATION: There is no trunk or limb dysmetria noted.  GAIT/STANCE: Posture is normal. Gait is steady   REVIEW OF SYSTEMS:  Full 14 system review of systems performed and notable only for as above All other review of systems were negative.   ALLERGIES: Allergies  Allergen Reactions  Avelox [Moxifloxacin Hcl In Nacl]     Hives,cold sweats    HOME MEDICATIONS: Current Outpatient Medications  Medication Sig Dispense Refill   APO-VARENICLINE 0.5 MG tablet Take 0.5 mg by mouth in the morning and at bedtime.  (Patient not taking: Reported on [id number])     Aspirin-Salicylamide-Caffeine (BC HEADACHE POWDER PO) Take by mouth daily as needed.     busPIRone (BUSPAR) 5 MG tablet Take 5 mg by mouth 2 (two) times daily.     DULoxetine (CYMBALTA) 60 MG capsule Take 1 capsule (60 mg total) by mouth daily. 30 capsule 11   naproxen (NAPROSYN) 500 MG tablet Take 1 tablet (500 mg total) by mouth 2 (two) times daily with a meal. 60 tablet 5   ondansetron (ZOFRAN-ODT) 8 MG disintegrating tablet Take 8 mg by mouth every 8 (eight) hours as needed for nausea or vomiting.     pantoprazole (PROTONIX) 40 MG tablet Take 40 mg by mouth daily.     UNABLE TO FIND Med Name: Methadone liquid     No current facility-administered medications for this visit.    PAST MEDICAL HISTORY: Past Medical History:  Diagnosis Date   Chronic knee pain    Headache(784.0)     PAST SURGICAL HISTORY: Past Surgical History:  Procedure Laterality Date   APPENDECTOMY     COLONOSCOPY     Hx: of   KNEE BURSECTOMY Left 08/21/2015   Procedure: KNEE BURSECTOMY;  Surgeon: Carole Civil, MD;  Location: [facility];  Service: Orthopedics;  Laterality: Left;  PREPATELLAR   ORIF WRIST FRACTURE Right 10/13/2012   Procedure: OPEN REDUCTION INTERNAL FIXATION (ORIF)RIGHT 5TH CARPAL METACARPAL FRACTURE HAND  ;  Surgeon: Roseanne Kaufman, MD;  Location: [facility];  Service: Orthopedics;  Laterality: Right;    FAMILY HISTORY: Family History  Problem Relation Age of Onset   Cancer - Other Other     SOCIAL HISTORY: Social History   Socioeconomic History   Marital status: Single    Spouse name: Not on file   Number of children: Not on file   Years of education: Not on file   Highest education level: Not on file  Occupational History   Not on file  Tobacco Use   Smoking status: Every Day    Packs/day: 1.00    Years: 16.00    Total pack years: 16.00    Types: Cigarettes   Smokeless tobacco: Never  Substance and Sexual Activity   Alcohol use:  Not Currently   Drug use: Not Currently    Comment: h/o drug problem, on Methadone now   Sexual activity: Yes    Birth control/protection: None  Other Topics Concern   Not on file  Social History Narrative   Lives with son 40 y.o.   Right handed   Caffeine: coffee 2-3 cups/day   Social Determinants of Health   Financial Resource Strain: Not on file  Food Insecurity: Not on file  Transportation Needs: Not on file  Physical Activity: Not on file  Stress: Not on file  Social Connections: Not on file  Intimate Partner Violence: Not on file      Marcial Pacas, M.D. Ph.D.  [facility] [address], [place], [place] [address] Ph: [phone] Fax: [phone]  CC:  Celene Squibb, MD [address] [place],  [place] [address]  Celene Squibb, MD

## 2021-06-16 NOTE — Procedures (Signed)
Full Name: Daniel Hays Gender: Male MRN #: [id number] Date of Birth: 1980-11-08    Visit Date: 06/16/2021 14:17 Age: 41 Years Referring Physician: Levert Feinstein Height: 6 feet 0 inch History: 41 years old right-handed male complains of acute worsening of right neck pain radiating pain to right arm  Summary of the test: Nerve conduction study, Bilateral median sensory and motor responses were normal.  Right ulnar sensory and motor responses were within normal limit.   Electromyography: Selected needle examination of right upper extremity and right cervical paraspinal muscles were normal, with exception of mild enlarged complex motor unit potential mixed with normal unit potential noted at the right C5-6 myotomes.  Conclusion: This is a slight abnormal study.  There is evidence of chronic mild right cervical radiculopathy mainly involving C5-6 myotomes, there is no evidence of acute process.    ------------------------------- Levert Feinstein M.D. PhD  [facility] [address] [place], [place] [address] Tel: [phone] Fax: [phone]  Verbal informed consent was obtained from the patient, patient was informed of potential risk of procedure, including bruising, bleeding, hematoma formation, infection, muscle weakness, muscle pain, numbness, among others.        MNC    Nerve / Sites Muscle Latency Ref. Amplitude Ref. Rel Amp Segments Distance Velocity Ref. Area    ms ms mV mV %  cm m/s m/s mVms  R Median - APB     Wrist APB 3.6 ?4.4 9.1 ?4.0 100 Wrist - APB 7   32.8     Upper arm APB 7.8  9.0  98.6 Upper arm - Wrist 22 53 ?49 31.9  L Median - APB     Wrist APB 3.5 ?4.4 10.8 ?4.0 100 Wrist - APB 7   39.3     Upper arm APB 8.5  9.0  83.4 Upper arm - Wrist 25.5 51 ?49 34.8  R Ulnar - ADM     Wrist ADM 2.4 ?3.3 8.2 ?6.0 100 Wrist - ADM 7   22.9     B.Elbow ADM 5.1  7.5  90.6 B.Elbow - Wrist 16 60 ?49 21.6     A.Elbow ADM 7.6  7.7  103 A.Elbow -  B.Elbow 15 59 ?49 23.4           SNC    Nerve / Sites Rec. Site Peak Lat Ref.  Amp Ref. Segments Distance    ms ms V V  cm  L Median - Digit III (Antidromic)     Wrist Dig III 3.3 ?3.6 25 ?15 Wrist - Dig III 14  R Median - Orthodromic (Dig II, Mid palm)     Dig II Wrist 3.3 ?3.4 18 ?10 Dig II - Wrist 13  R Ulnar - Orthodromic, (Dig V, Mid palm)     Dig V Wrist 2.6 ?3.1 8 ?5 Dig V - Wrist 87           F  Wave    Nerve F Lat Ref.   ms ms  R Ulnar - ADM 29.9 ?32.0       EMG Summary Table    Spontaneous MUAP Recruitment  Muscle IA Fib PSW Fasc Other Amp Dur. Poly Pattern  R. First dorsal interosseous Normal None None None _______ Normal Normal Normal Normal  R. Pronator teres Normal None None None _______ Normal Normal Normal Normal  R. Flexor digitorum profundus (Ulnar) Normal None None None _______ Normal Normal Normal Normal  R. Biceps  brachii Normal None None None _______ Increased Increased 1+ Reduced  R. Deltoid Normal None None None _______ Normal Increased 1+ Reduced  R. Triceps brachii Normal None None None _______ Normal Normal Normal Reduced  R. Brachioradialis Normal None None None _______ Normal Normal Normal Normal  R. Extensor digitorum communis Normal None None None _______ Normal Normal Normal Normal  R. Cervical paraspinals Normal None None None _______ Normal Normal Normal Normal  L. Biceps brachii Normal None None None _______ Normal Normal Normal Normal  L. Deltoid Normal None None None _______ Normal Normal Normal Normal  L. Triceps brachii Normal None None None _______ Normal Normal Normal Normal  L. Thoracic paraspinals Normal None None None _______ Normal Normal Normal Normal

## 2021-06-22 ENCOUNTER — Encounter (INDEPENDENT_AMBULATORY_CARE_PROVIDER_SITE_OTHER): Payer: Self-pay | Admitting: *Deleted

## 2021-06-22 ENCOUNTER — Ambulatory Visit (INDEPENDENT_AMBULATORY_CARE_PROVIDER_SITE_OTHER): Payer: BC Managed Care – PPO | Admitting: Gastroenterology

## 2021-06-22 ENCOUNTER — Encounter (INDEPENDENT_AMBULATORY_CARE_PROVIDER_SITE_OTHER): Payer: Self-pay | Admitting: Gastroenterology

## 2021-06-22 DIAGNOSIS — F1120 Opioid dependence, uncomplicated: Secondary | ICD-10-CM | POA: Diagnosis not present

## 2021-06-23 DIAGNOSIS — F1120 Opioid dependence, uncomplicated: Secondary | ICD-10-CM | POA: Diagnosis not present

## 2021-06-29 DIAGNOSIS — F1120 Opioid dependence, uncomplicated: Secondary | ICD-10-CM | POA: Diagnosis not present

## 2021-07-06 DIAGNOSIS — F1120 Opioid dependence, uncomplicated: Secondary | ICD-10-CM | POA: Diagnosis not present

## 2021-07-07 DIAGNOSIS — Z5181 Encounter for therapeutic drug level monitoring: Secondary | ICD-10-CM | POA: Diagnosis not present

## 2021-07-07 DIAGNOSIS — Z79899 Other long term (current) drug therapy: Secondary | ICD-10-CM | POA: Diagnosis not present

## 2021-07-07 DIAGNOSIS — F1120 Opioid dependence, uncomplicated: Secondary | ICD-10-CM | POA: Diagnosis not present

## 2021-07-08 DIAGNOSIS — F1120 Opioid dependence, uncomplicated: Secondary | ICD-10-CM | POA: Diagnosis not present

## 2021-07-13 DIAGNOSIS — F1120 Opioid dependence, uncomplicated: Secondary | ICD-10-CM | POA: Diagnosis not present

## 2021-07-20 DIAGNOSIS — F1120 Opioid dependence, uncomplicated: Secondary | ICD-10-CM | POA: Diagnosis not present

## 2021-07-21 DIAGNOSIS — F1120 Opioid dependence, uncomplicated: Secondary | ICD-10-CM | POA: Diagnosis not present

## 2021-07-27 DIAGNOSIS — F1120 Opioid dependence, uncomplicated: Secondary | ICD-10-CM | POA: Diagnosis not present

## 2021-08-03 DIAGNOSIS — F1120 Opioid dependence, uncomplicated: Secondary | ICD-10-CM | POA: Diagnosis not present

## 2021-08-04 DIAGNOSIS — F1120 Opioid dependence, uncomplicated: Secondary | ICD-10-CM | POA: Diagnosis not present

## 2021-08-10 DIAGNOSIS — F1120 Opioid dependence, uncomplicated: Secondary | ICD-10-CM | POA: Diagnosis not present

## 2021-08-17 DIAGNOSIS — Z5181 Encounter for therapeutic drug level monitoring: Secondary | ICD-10-CM | POA: Diagnosis not present

## 2021-08-17 DIAGNOSIS — F1120 Opioid dependence, uncomplicated: Secondary | ICD-10-CM | POA: Diagnosis not present

## 2021-08-17 DIAGNOSIS — Z79899 Other long term (current) drug therapy: Secondary | ICD-10-CM | POA: Diagnosis not present

## 2021-09-05 DIAGNOSIS — F1120 Opioid dependence, uncomplicated: Secondary | ICD-10-CM | POA: Diagnosis not present

## 2021-09-19 DIAGNOSIS — F1120 Opioid dependence, uncomplicated: Secondary | ICD-10-CM | POA: Diagnosis not present

## 2021-10-03 DIAGNOSIS — F1120 Opioid dependence, uncomplicated: Secondary | ICD-10-CM | POA: Diagnosis not present

## 2021-10-17 DIAGNOSIS — F1120 Opioid dependence, uncomplicated: Secondary | ICD-10-CM | POA: Diagnosis not present

## 2021-10-31 DIAGNOSIS — F1120 Opioid dependence, uncomplicated: Secondary | ICD-10-CM | POA: Diagnosis not present

## 2021-11-14 DIAGNOSIS — F1120 Opioid dependence, uncomplicated: Secondary | ICD-10-CM | POA: Diagnosis not present

## 2021-11-16 DIAGNOSIS — F1120 Opioid dependence, uncomplicated: Secondary | ICD-10-CM | POA: Diagnosis not present

## 2021-11-28 DIAGNOSIS — F1120 Opioid dependence, uncomplicated: Secondary | ICD-10-CM | POA: Diagnosis not present

## 2021-12-12 DIAGNOSIS — F1120 Opioid dependence, uncomplicated: Secondary | ICD-10-CM | POA: Diagnosis not present

## 2021-12-14 DIAGNOSIS — F1120 Opioid dependence, uncomplicated: Secondary | ICD-10-CM | POA: Diagnosis not present

## 2021-12-26 DIAGNOSIS — F1120 Opioid dependence, uncomplicated: Secondary | ICD-10-CM | POA: Diagnosis not present

## 2022-01-09 DIAGNOSIS — F1120 Opioid dependence, uncomplicated: Secondary | ICD-10-CM | POA: Diagnosis not present

## 2022-01-11 DIAGNOSIS — F1120 Opioid dependence, uncomplicated: Secondary | ICD-10-CM | POA: Diagnosis not present

## 2022-01-23 DIAGNOSIS — F1120 Opioid dependence, uncomplicated: Secondary | ICD-10-CM | POA: Diagnosis not present

## 2022-02-06 DIAGNOSIS — F1120 Opioid dependence, uncomplicated: Secondary | ICD-10-CM | POA: Diagnosis not present

## 2022-02-08 DIAGNOSIS — F1120 Opioid dependence, uncomplicated: Secondary | ICD-10-CM | POA: Diagnosis not present

## 2022-02-20 DIAGNOSIS — F1120 Opioid dependence, uncomplicated: Secondary | ICD-10-CM | POA: Diagnosis not present

## 2022-02-22 DIAGNOSIS — K219 Gastro-esophageal reflux disease without esophagitis: Secondary | ICD-10-CM | POA: Diagnosis not present

## 2022-02-22 DIAGNOSIS — R030 Elevated blood-pressure reading, without diagnosis of hypertension: Secondary | ICD-10-CM | POA: Diagnosis not present

## 2022-03-03 ENCOUNTER — Encounter: Payer: Self-pay | Admitting: Radiology

## 2022-03-06 DIAGNOSIS — F1120 Opioid dependence, uncomplicated: Secondary | ICD-10-CM | POA: Diagnosis not present

## 2022-03-11 DIAGNOSIS — R030 Elevated blood-pressure reading, without diagnosis of hypertension: Secondary | ICD-10-CM | POA: Diagnosis not present

## 2022-03-17 ENCOUNTER — Other Ambulatory Visit (HOSPITAL_COMMUNITY): Payer: Self-pay | Admitting: Family Medicine

## 2022-03-17 DIAGNOSIS — E785 Hyperlipidemia, unspecified: Secondary | ICD-10-CM | POA: Diagnosis not present

## 2022-03-17 DIAGNOSIS — R0789 Other chest pain: Secondary | ICD-10-CM | POA: Diagnosis not present

## 2022-03-17 DIAGNOSIS — Z720 Tobacco use: Secondary | ICD-10-CM | POA: Diagnosis not present

## 2022-03-17 DIAGNOSIS — K219 Gastro-esophageal reflux disease without esophagitis: Secondary | ICD-10-CM | POA: Diagnosis not present

## 2022-03-17 DIAGNOSIS — F1910 Other psychoactive substance abuse, uncomplicated: Secondary | ICD-10-CM | POA: Diagnosis not present

## 2022-03-17 DIAGNOSIS — F419 Anxiety disorder, unspecified: Secondary | ICD-10-CM | POA: Diagnosis not present

## 2022-03-20 DIAGNOSIS — F1120 Opioid dependence, uncomplicated: Secondary | ICD-10-CM | POA: Diagnosis not present

## 2022-03-22 DIAGNOSIS — F1120 Opioid dependence, uncomplicated: Secondary | ICD-10-CM | POA: Diagnosis not present

## 2022-04-03 DIAGNOSIS — F1120 Opioid dependence, uncomplicated: Secondary | ICD-10-CM | POA: Diagnosis not present

## 2022-04-05 DIAGNOSIS — F1120 Opioid dependence, uncomplicated: Secondary | ICD-10-CM | POA: Diagnosis not present

## 2022-04-17 DIAGNOSIS — F1120 Opioid dependence, uncomplicated: Secondary | ICD-10-CM | POA: Diagnosis not present

## 2022-05-01 DIAGNOSIS — F1120 Opioid dependence, uncomplicated: Secondary | ICD-10-CM | POA: Diagnosis not present

## 2022-05-15 DIAGNOSIS — F1120 Opioid dependence, uncomplicated: Secondary | ICD-10-CM | POA: Diagnosis not present

## 2022-05-17 DIAGNOSIS — F1120 Opioid dependence, uncomplicated: Secondary | ICD-10-CM | POA: Diagnosis not present

## 2022-05-29 DIAGNOSIS — F1120 Opioid dependence, uncomplicated: Secondary | ICD-10-CM | POA: Diagnosis not present

## 2022-06-12 DIAGNOSIS — F1120 Opioid dependence, uncomplicated: Secondary | ICD-10-CM | POA: Diagnosis not present

## 2022-06-14 DIAGNOSIS — F1120 Opioid dependence, uncomplicated: Secondary | ICD-10-CM | POA: Diagnosis not present

## 2022-06-26 DIAGNOSIS — F1120 Opioid dependence, uncomplicated: Secondary | ICD-10-CM | POA: Diagnosis not present

## 2022-07-10 DIAGNOSIS — F1120 Opioid dependence, uncomplicated: Secondary | ICD-10-CM | POA: Diagnosis not present

## 2022-07-12 DIAGNOSIS — F1120 Opioid dependence, uncomplicated: Secondary | ICD-10-CM | POA: Diagnosis not present

## 2022-07-24 DIAGNOSIS — F1120 Opioid dependence, uncomplicated: Secondary | ICD-10-CM | POA: Diagnosis not present

## 2022-08-07 DIAGNOSIS — F1120 Opioid dependence, uncomplicated: Secondary | ICD-10-CM | POA: Diagnosis not present

## 2022-08-09 DIAGNOSIS — F1120 Opioid dependence, uncomplicated: Secondary | ICD-10-CM | POA: Diagnosis not present

## 2022-08-23 DIAGNOSIS — F1120 Opioid dependence, uncomplicated: Secondary | ICD-10-CM | POA: Diagnosis not present

## 2022-09-06 DIAGNOSIS — F1120 Opioid dependence, uncomplicated: Secondary | ICD-10-CM | POA: Diagnosis not present

## 2022-09-20 DIAGNOSIS — F1120 Opioid dependence, uncomplicated: Secondary | ICD-10-CM | POA: Diagnosis not present

## 2022-10-04 DIAGNOSIS — F1120 Opioid dependence, uncomplicated: Secondary | ICD-10-CM | POA: Diagnosis not present

## 2022-10-18 DIAGNOSIS — F1120 Opioid dependence, uncomplicated: Secondary | ICD-10-CM | POA: Diagnosis not present

## 2022-11-01 DIAGNOSIS — F1120 Opioid dependence, uncomplicated: Secondary | ICD-10-CM | POA: Diagnosis not present

## 2022-11-15 DIAGNOSIS — F1120 Opioid dependence, uncomplicated: Secondary | ICD-10-CM | POA: Diagnosis not present

## 2022-11-29 DIAGNOSIS — E785 Hyperlipidemia, unspecified: Secondary | ICD-10-CM | POA: Insufficient documentation

## 2022-11-29 DIAGNOSIS — F1120 Opioid dependence, uncomplicated: Secondary | ICD-10-CM | POA: Diagnosis not present

## 2022-12-13 DIAGNOSIS — F1120 Opioid dependence, uncomplicated: Secondary | ICD-10-CM | POA: Diagnosis not present

## 2022-12-27 DIAGNOSIS — F1120 Opioid dependence, uncomplicated: Secondary | ICD-10-CM | POA: Diagnosis not present

## 2023-01-10 DIAGNOSIS — F1120 Opioid dependence, uncomplicated: Secondary | ICD-10-CM | POA: Diagnosis not present

## 2023-01-24 DIAGNOSIS — F1120 Opioid dependence, uncomplicated: Secondary | ICD-10-CM | POA: Diagnosis not present

## 2023-01-31 DIAGNOSIS — F1120 Opioid dependence, uncomplicated: Secondary | ICD-10-CM | POA: Diagnosis not present

## 2023-02-14 DIAGNOSIS — F1120 Opioid dependence, uncomplicated: Secondary | ICD-10-CM | POA: Diagnosis not present

## 2023-02-22 ENCOUNTER — Other Ambulatory Visit (HOSPITAL_COMMUNITY): Payer: Self-pay | Admitting: Internal Medicine

## 2023-02-22 ENCOUNTER — Ambulatory Visit (HOSPITAL_COMMUNITY)
Admission: RE | Admit: 2023-02-22 | Discharge: 2023-02-22 | Disposition: A | Discharge status: Home / Self Care | Payer: BC Managed Care – PPO | Source: Ambulatory Visit | Attending: Internal Medicine | Admitting: Internal Medicine

## 2023-02-22 DIAGNOSIS — F1910 Other psychoactive substance abuse, uncomplicated: Secondary | ICD-10-CM | POA: Diagnosis not present

## 2023-02-22 DIAGNOSIS — R0789 Other chest pain: Secondary | ICD-10-CM

## 2023-02-22 DIAGNOSIS — M5412 Radiculopathy, cervical region: Secondary | ICD-10-CM | POA: Diagnosis not present

## 2023-02-22 DIAGNOSIS — F419 Anxiety disorder, unspecified: Secondary | ICD-10-CM | POA: Diagnosis not present

## 2023-02-22 DIAGNOSIS — R042 Hemoptysis: Secondary | ICD-10-CM | POA: Diagnosis not present

## 2023-03-17 DIAGNOSIS — E785 Hyperlipidemia, unspecified: Secondary | ICD-10-CM | POA: Diagnosis not present

## 2023-03-30 ENCOUNTER — Other Ambulatory Visit (HOSPITAL_COMMUNITY): Payer: Self-pay | Admitting: Family Medicine

## 2023-03-30 DIAGNOSIS — N50819 Testicular pain, unspecified: Secondary | ICD-10-CM

## 2023-03-30 DIAGNOSIS — F1910 Other psychoactive substance abuse, uncomplicated: Secondary | ICD-10-CM | POA: Diagnosis not present

## 2023-03-30 DIAGNOSIS — E785 Hyperlipidemia, unspecified: Secondary | ICD-10-CM | POA: Diagnosis not present

## 2023-03-30 DIAGNOSIS — F419 Anxiety disorder, unspecified: Secondary | ICD-10-CM | POA: Diagnosis not present

## 2023-03-30 DIAGNOSIS — R042 Hemoptysis: Secondary | ICD-10-CM | POA: Diagnosis not present

## 2023-03-30 DIAGNOSIS — R0789 Other chest pain: Secondary | ICD-10-CM | POA: Diagnosis not present

## 2023-04-07 ENCOUNTER — Ambulatory Visit (HOSPITAL_COMMUNITY)
Admission: RE | Admit: 2023-04-07 | Discharge: 2023-04-07 | Disposition: A | Discharge status: Home / Self Care | Source: Ambulatory Visit | Attending: Family Medicine | Admitting: Family Medicine

## 2023-04-07 DIAGNOSIS — N50819 Testicular pain, unspecified: Secondary | ICD-10-CM | POA: Insufficient documentation

## 2023-04-07 DIAGNOSIS — N433 Hydrocele, unspecified: Secondary | ICD-10-CM | POA: Diagnosis not present

## 2023-04-07 DIAGNOSIS — N50812 Left testicular pain: Secondary | ICD-10-CM | POA: Diagnosis not present

## 2023-04-07 DIAGNOSIS — N503 Cyst of epididymis: Secondary | ICD-10-CM | POA: Diagnosis not present

## 2023-04-07 DIAGNOSIS — N50811 Right testicular pain: Secondary | ICD-10-CM | POA: Diagnosis not present

## 2023-05-05 ENCOUNTER — Encounter: Payer: Self-pay | Admitting: Urology

## 2023-05-05 ENCOUNTER — Ambulatory Visit: Admitting: Urology

## 2023-05-05 VITALS — BP 151/105 | HR 80

## 2023-05-05 DIAGNOSIS — N50812 Left testicular pain: Secondary | ICD-10-CM | POA: Diagnosis not present

## 2023-05-05 DIAGNOSIS — N5082 Scrotal pain: Secondary | ICD-10-CM | POA: Diagnosis not present

## 2023-05-05 DIAGNOSIS — N50811 Right testicular pain: Secondary | ICD-10-CM

## 2023-05-05 DIAGNOSIS — N503 Cyst of epididymis: Secondary | ICD-10-CM

## 2023-05-05 LAB — URINALYSIS, ROUTINE W REFLEX MICROSCOPIC
Bilirubin, UA: NEGATIVE
Glucose, UA: NEGATIVE
Ketones, UA: NEGATIVE
Leukocytes,UA: NEGATIVE
Nitrite, UA: NEGATIVE
Protein,UA: NEGATIVE
RBC, UA: NEGATIVE
Specific Gravity, UA: 1.03 (ref 1.005–1.030)
Urobilinogen, Ur: 0.2 mg/dL (ref 0.2–1.0)
pH, UA: 5.5 (ref 5.0–7.5)

## 2023-05-05 NOTE — Progress Notes (Signed)
 05/05/2023 11:58 AM   Daniel Hays 1985/02/03 [id number]  Referring provider: Wendi Ham, NP [address] [place],  [place] [address]  No chief complaint on file.   HPI: Mr Prevatt is a 43yo here for evaluation of bilateral testicular pain. Starting 2 months ago he noted testicular masses. He underwent scrotal US  which showed bilateral 1.5-3.1cm epididymal cysts. He has intermittent testicular pain with activity. No significant LUTS.    PMH: Past Medical History:  Diagnosis Date   Chronic knee pain    Headache(784.0)     Surgical History: Past Surgical History:  Procedure Laterality Date   APPENDECTOMY     COLONOSCOPY     Hx: of   KNEE BURSECTOMY Left 08/21/2015   Procedure: KNEE BURSECTOMY;  Surgeon: Darrin Emerald, MD;  Location: [facility];  Service: Orthopedics;  Laterality: Left;  PREPATELLAR   ORIF WRIST FRACTURE Right 10/13/2012   Procedure: OPEN REDUCTION INTERNAL FIXATION (ORIF)RIGHT 5TH CARPAL METACARPAL FRACTURE HAND  ;  Surgeon: Ronn Cohn, MD;  Location: [facility];  Service: Orthopedics;  Laterality: Right;    Home Medications:  Allergies as of 05/05/2023       Reactions   Avelox [moxifloxacin Hcl In Nacl]    Hives,cold sweats        Medication List        Accurate as of May 05, 2023 11:58 AM. If you have any questions, ask your nurse or doctor.          amLODipine 5 MG tablet Commonly known as: NORVASC Take 5 mg by mouth daily.   APO-Varenicline 0.5 MG tablet Generic drug: varenicline Take 0.5 mg by mouth in the morning and at bedtime.   BC HEADACHE POWDER PO Take by mouth daily as needed.   busPIRone 5 MG tablet Commonly known as: BUSPAR Take 5 mg by mouth 2 (two) times daily.   DULoxetine  60 MG capsule Commonly known as: Cymbalta  Take 1 capsule (60 mg total) by mouth daily.   gabapentin  300 MG capsule Commonly known as: NEURONTIN  Take 1 capsule (300 mg total) by mouth 3 (three) times daily.   meloxicam  15 MG  tablet Commonly known as: Mobic  Take 1 tablet (15 mg total) by mouth daily as needed for pain.   methylPREDNISolone  4 MG Tbpk tablet Commonly known as: MEDROL  DOSEPAK Tapering dose per instruction   naproxen  500 MG tablet Commonly known as: NAPROSYN  Take 1 tablet (500 mg total) by mouth 2 (two) times daily with a meal.   ondansetron  8 MG disintegrating tablet Commonly known as: ZOFRAN -ODT Take 8 mg by mouth every 8 (eight) hours as needed for nausea or vomiting.   pantoprazole  40 MG tablet Commonly known as: PROTONIX  Take 40 mg by mouth daily.   UNABLE TO FIND Med Name: Methadone liquid        Allergies:  Allergies  Allergen Reactions   Avelox [Moxifloxacin Hcl In Nacl]     Hives,cold sweats    Family History: Family History  Problem Relation Age of Onset   Cancer - Other Other     Social History:  reports that he has been smoking cigarettes. He has a 16 pack-year smoking history. He has never used smokeless tobacco. He reports that he does not currently use alcohol . He reports that he does not currently use drugs.  ROS: All other review of systems were reviewed and are negative except what is noted above in HPI  Physical Exam: BP (!) 151/105   Pulse 80  Constitutional:  Alert and oriented, No acute distress. HEENT: [place] AT, moist mucus membranes.  Trachea midline, no masses. Cardiovascular: No clubbing, cyanosis, or edema. Respiratory: Normal respiratory effort, no increased work of breathing. GI: Abdomen is soft, nontender, nondistended, no abdominal masses GU: No CVA tenderness. Circumcised phallus. 3cm left epididymal head cyst, mildly tender Lymph: No cervical or inguinal lymphadenopathy. Skin: No rashes, bruises or suspicious lesions. Neurologic: Grossly intact, no focal deficits, moving all 4 extremities. Psychiatric: Normal mood and affect.  Laboratory Data: Lab Results  Component Value Date   WBC 8.9 08/19/2015   HGB 15.5 08/19/2015   HCT 46.2  08/19/2015   MCV 94.3 08/19/2015   PLT 289 08/19/2015    Lab Results  Component Value Date   CREATININE 0.82 08/19/2015    No results found for: "PSA"  No results found for: "TESTOSTERONE"  No results found for: "HGBA1C"  Urinalysis    Component Value Date/Time   COLORURINE YELLOW 07/24/2010 2229   APPEARANCEUR CLEAR 07/24/2010 2229   LABSPEC 1.029 07/24/2010 2229   PHURINE 6.0 07/24/2010 2229   GLUCOSEU NEGATIVE 07/24/2010 2229   HGBUR NEGATIVE 07/24/2010 2229   BILIRUBINUR NEGATIVE 07/24/2010 2229   KETONESUR NEGATIVE 07/24/2010 2229   PROTEINUR NEGATIVE 07/24/2010 2229   UROBILINOGEN 0.2 07/24/2010 2229   NITRITE NEGATIVE 07/24/2010 2229   LEUKOCYTESUR NEGATIVE 07/24/2010 2229    No results found for: "LABMICR", "WBCUA", "RBCUA", "LABEPIT", "MUCUS", "BACTERIA"  Pertinent Imaging: Scrotal US  04/07/2023: Images reviewed and discussed with the patient  No results found for this or any previous visit.  No results found for this or any previous visit.  No results found for this or any previous visit.  No results found for this or any previous visit.  No results found for this or any previous visit.  No results found for this or any previous visit.  No results found for this or any previous visit.  No results found for this or any previous visit.   Assessment & Plan:    1. Pain in both testicles The risks/benefits/alternatives to left epididymal cyst excision was explained to the patient and he udnerstands and wishes to proceed with surgery   No follow-ups on file.  Johnie Nailer, MD  [facility] [place]

## 2023-05-05 NOTE — Patient Instructions (Signed)
Spermatocele  A spermatocele is a fluid-filled sac (cyst) inside the sac that holds the testicles (scrotum). This type of cyst often forms in the epididymis. The epididymis is a coiled tube at the top of each testicle, and this tube is where sperm are stored. The cyst sometimes forms along a tube called the vas deferens, which is a tube that carries sperm away from the epididymis. Spermatoceles are usually painless. Most cysts are small, but they can grow larger. Spermatoceles are not cancerous (are benign). What are the causes? The cause of this condition is not known. However, this condition usually results from a blockage in one of the many small tubes (tubules) that carry sperm from your testicle to your vas deferens. What are the signs or symptoms? In most cases, small cysts do not cause symptoms. However, symptoms sometimes occur. Symptoms of this condition include: Dull pain. A feeling of heaviness. An enlarged scrotum, if your cyst is large. How is this diagnosed? This condition is diagnosed based on a physical exam. You or your health care provider may notice your cyst when feeling your scrotum. Your health care provider may shine a light through (transilluminate) your scrotum to see if light will pass through your cyst. You may have an ultrasound of the scrotum to rule out a tumor. How is this treated? Small spermatoceles do not need to be treated. If your spermatocele has grown large or is uncomfortable, your health care provider may recommend surgery to remove it. Follow these instructions at home: Check your spermatocele regularly for any changes. Do regular self-exams of your scrotum. Keep all follow-up visits. This is important. Contact a health care provider if: Your spermatocele gets larger. You have pain in your scrotum. Your spermatocele comes back after treatment. Get help right away if: You experience severe pain and redness of your scrotum. Summary A spermatocele  is a fluid-filled sac, or a cyst, inside the sac that holds the testicles (scrotum). This condition is usually painless, and it is not cancerous (is benign). Your health care provider may recommend surgery to remove your spermatocele if it grows large or is uncomfortable. If you have a spermatocele, check for any changes and do self-exams of your scrotum. Keep all follow-up visits. This is important. This information is not intended to replace advice given to you by your health care provider. Make sure you discuss any questions you have with your health care provider. Document Revised: 08/10/2020 Document Reviewed: 08/10/2020 Elsevier Patient Education  2024 [employer]  

## 2023-05-11 IMAGING — MR MR WRIST*L* W/O CM
6 series · 40 of 40 positions shown · non-contrast
Comparison: Radiographs 06/16/2020

CLINICAL DATA: Left wrist pain for 3 months.  No specific injury.

EXAM:
MR OF THE LEFT WRIST WITHOUT CONTRAST
TECHNIQUE: Multiplanar, multisequence MR imaging of the left wrist was
performed. No intravenous contrast was administered.

[Series 3: T2 fat-sat · axial · left · 3.0mm · 0.48mm/px · z∈[+33,+94]mm · 6 of 20 slices shown (1 of 2)]
[im 1/20]
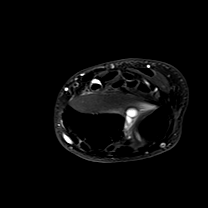
[im 4/20]
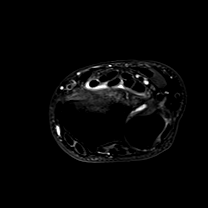
[im 8/20]
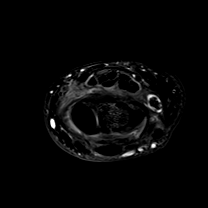
[im 12/20]
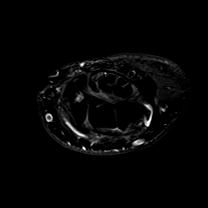
[im 16/20]
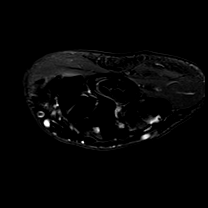
[im 20/20]
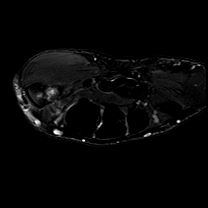

[Series 4: T1 · axial · left · 3.0mm · 0.31mm/px · z∈[+33,+94]mm · 6 of 20 slices shown (1 of 2)]
[im 1/20]
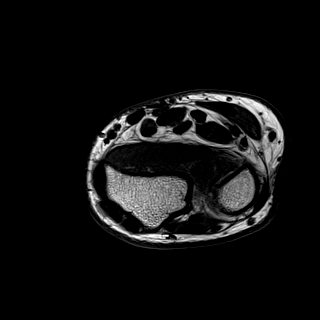
[im 4/20]
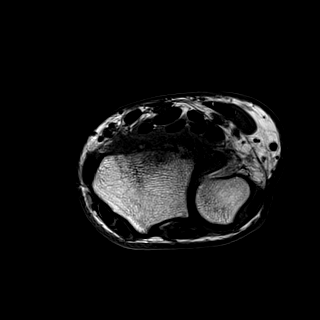
[im 8/20]
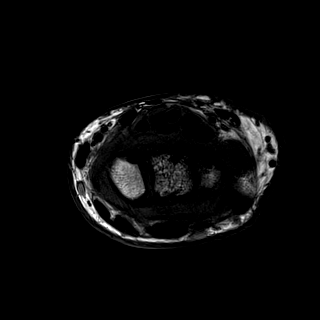
[im 12/20]
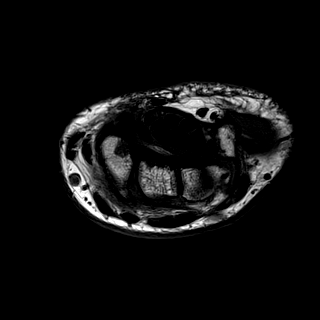
[im 16/20]
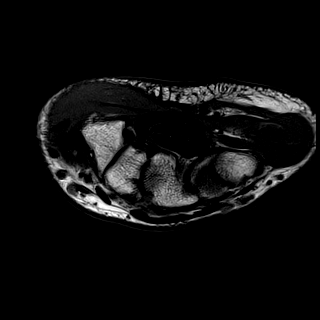
[im 20/20]
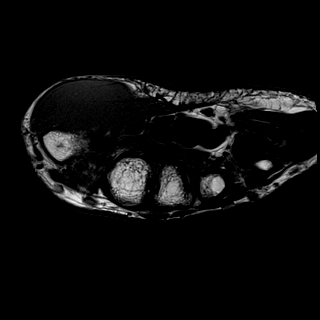

[Series 5: T1 · coronal · left · 2.0mm · 0.31mm/px · 7 of 24 slices shown (2 of 2)]
[im 1/24]
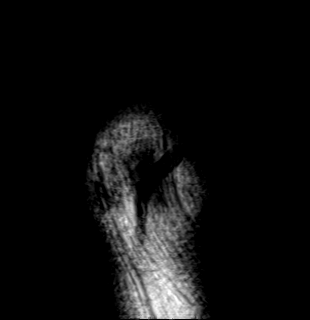
[im 4/24]
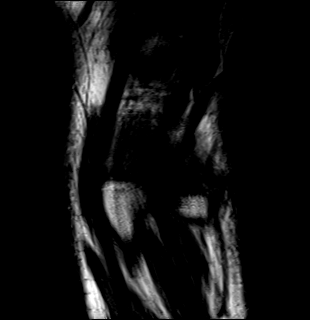
[im 8/24]
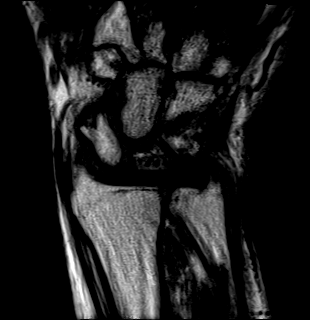
[im 12/24]
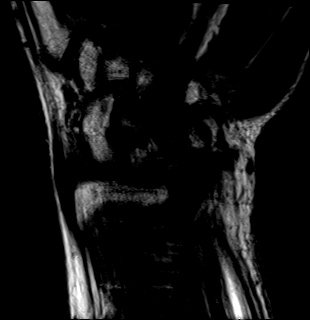
[im 16/24]
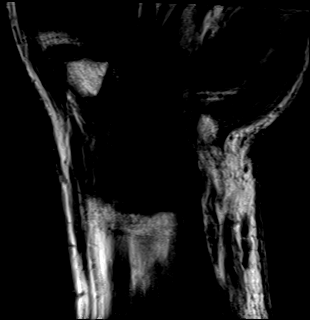
[im 20/24]
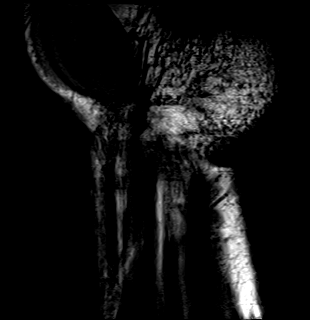
[im 24/24]
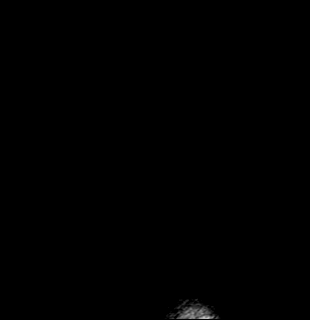

[Series 6: T2 fat-sat · coronal · left · 2.0mm · 0.45mm/px · 7 of 24 slices shown (2 of 2)]
[im 1/24]
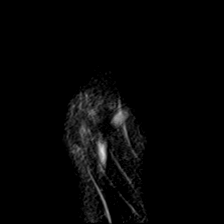
[im 4/24]
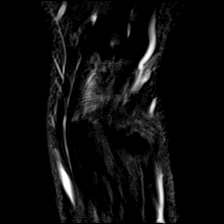
[im 8/24]
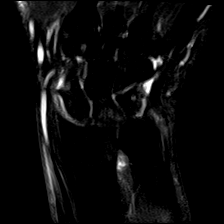
[im 12/24]
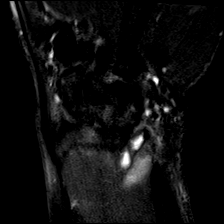
[im 16/24]
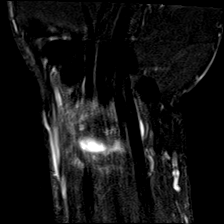
[im 20/24]
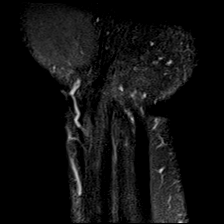
[im 24/24]
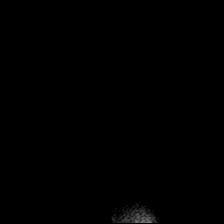

[Series 7: PD fat-sat · coronal · left · 2.0mm · 0.33mm/px · 7 of 24 slices shown (1 of 2)]
[im 1/24]
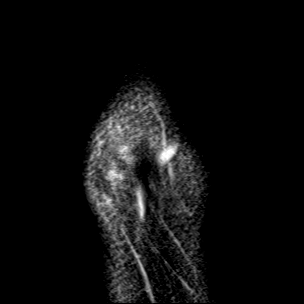
[im 4/24]
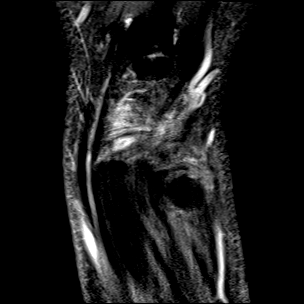
[im 8/24]
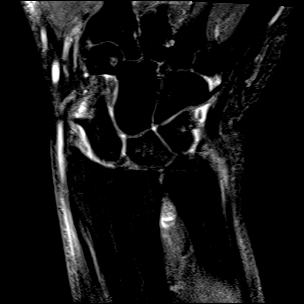
[im 12/24]
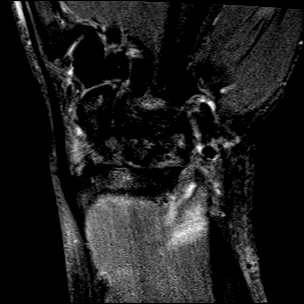
[im 16/24]
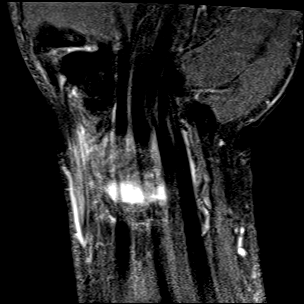
[im 20/24]
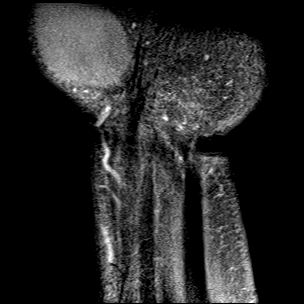
[im 24/24]
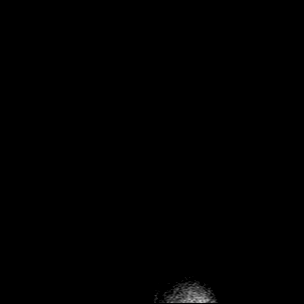

[Series 8: PD fat-sat · sagittal · left · 2.5mm · 0.33mm/px · 7 of 22 slices shown (2 of 2)]
[im 1/22]
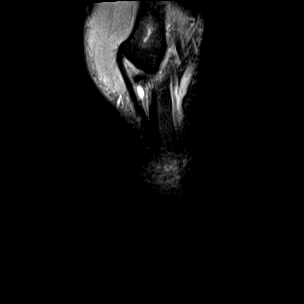
[im 4/22]
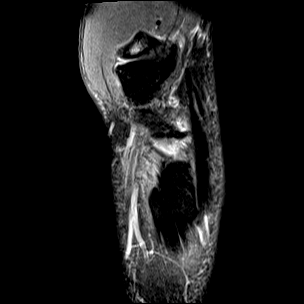
[im 8/22]
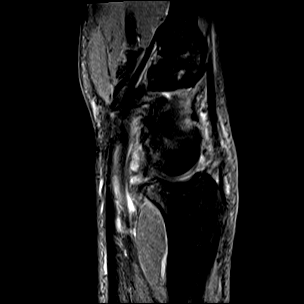
[im 11/22]
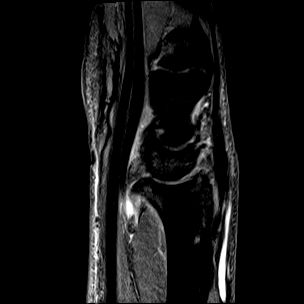
[im 15/22]
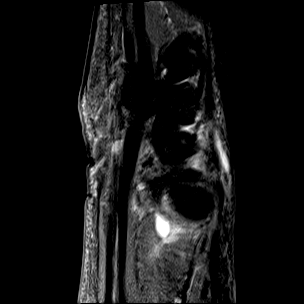
[im 18/22]
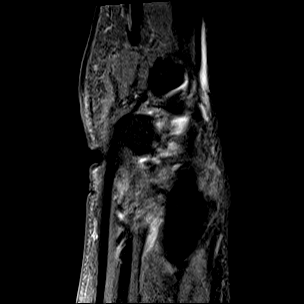
[im 22/22]
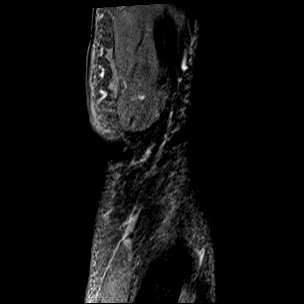

[40 of 40 positions shown; findings below may reference images not displayed]

FINDINGS: Examination is somewhat limited by patient motion.

Ligaments: The scapholunate joint space is slightly widened and
there is fluid in the joint. I do not see an obvious ligament tear
but a small tear or perforation is possible. The lunotriquetral
ligament is grossly intact.

Triangular fibrocartilage: No obvious tear is identified. There is
moderate fluid in the radioulnar joint.

Tendons: The dorsal wrist tendons are intact. No significant
tendinopathy the or tenosynovitis.

Carpal tunnel/median nerve: There is fluid in the carpal tunnel. The
tendons are intact. The median nerve is grossly normal.

Guyon's canal: Unremarkable.

Joint/cartilage: Wrist joint effusion and mild synovitis. There is
also a loose ossified body noted near the pisiform.

Age advanced degenerative changes involving the wrist with areas of
subchondral cystic change, early spurring and patchy areas of marrow
edema. There are also degenerative changes at the CMC joint of the
thumb.

Bones/carpal alignment: No acute fracture or evidence of AVN. Patchy
marrow edema and small cysts are noted in the lunate. Carpal
alignment is grossly normal.

Other: Unremarkable hand musculature.
IMPRESSION: 1. Age advanced degenerative changes as detailed above.
Posttraumatic arthritis versus possible inflammatory arthropathy.
2. No acute fracture or bone lesion.
3. Slight widening of the scapholunate joint space with a small
amount of fluid but no obvious scapholunate ligament tear. There is
also fluid in the radioulnar joint but no obvious TFCC tear.
4. Carpal tunnel fluid and suspected synovitis.

## 2023-05-19 ENCOUNTER — Telehealth: Payer: Self-pay | Admitting: Urology

## 2023-05-19 NOTE — Telephone Encounter (Signed)
 Left message he needs to change surgery date due to transportation

## 2023-05-22 NOTE — Telephone Encounter (Signed)
 Patient given 3 surgery dates.  He will call me back once he secures transportation.

## 2023-05-22 NOTE — Telephone Encounter (Signed)
 Documented in surgery work que.

## 2023-06-05 ENCOUNTER — Encounter (HOSPITAL_COMMUNITY): Payer: Self-pay

## 2023-06-05 NOTE — Patient Instructions (Signed)
 Daniel Hays  06/05/2023     @PREFPERIOPPHARMACY @   Your procedure is scheduled on  06/08/2023.   Report to [facility] at  1000 A.M.   Call this number if you have problems the morning of surgery:  [phone]  If you experience any cold or flu symptoms such as cough, fever, chills, shortness of breath, etc. between now and your scheduled surgery, please notify us  at the above number.   Remember:  Do not eat after midnight.   You may drink clear liquids until 0800 am on 06/08/2023.    Clear liquids allowed are:                    Water, Juice (No red color; non-citric and without pulp; diabetics please choose diet or no sugar options), Carbonated beverages (diabetics please choose diet or no sugar options), Clear Tea (No creamer, milk, or cream, including half & half and powdered creamer), Black Coffee Only (No creamer, milk or cream, including half & half and powdered creamer), and Clear Sports drink (No red color; diabetics please choose diet or no sugar options)    Take these medicines the morning of surgery with A SIP OF WATER                              amlodipine, duloxetine , pantoprazole .    Do not wear jewelry, make-up or nail polish, including gel polish,  artificial nails, or any other type of covering on natural nails (fingers and  toes).  Do not wear lotions, powders, or perfumes, or deodorant.  Do not shave 48 hours prior to surgery.  Men may shave face and neck.  Do not bring valuables to the hospital.  [facility] is not responsible for any belongings or valuables.  Contacts, dentures or bridgework may not be worn into surgery.  Leave your suitcase in the car.  After surgery it may be brought to your room.  For patients admitted to the hospital, discharge time will be determined by your treatment team.  Patients discharged the day of surgery will not be allowed to drive home and must have someone with them for 24 hours.    Special instructions:   DO  NOT smoke tobacco or vape for 24 hours before your procedure.  Please read over the following fact sheets that you were given. Coughing and Deep Breathing, Surgical Site Infection Prevention, Anesthesia Post-op Instructions, and Care and Recovery After Surgery       Epididymectomy, Care After The following information offers guidance on how to care for yourself after your procedure. Your health care provider may also give you more specific instructions. If you have problems or questions, contact your health care provider. What can I expect after the procedure? After the procedure, it is common to have: Pain and swelling in the area where your incision was made. Some bruising around the penis and the sac that holds your testicles (scrotum). Follow these instructions at home: Medicines Take over-the-counter and prescription medicines only as told by your health care provider. If you were prescribed an antibiotic medicine, take it as told by your health care provider. Do not stop using the antibiotic even if you start to feel better. Eating and drinking Follow instructions from your health care provider about eating or drinking restrictions. Drink enough fluid to keep your urine pale yellow. Bathing  Do not take baths,  swim, or use a hot tub until your health care provider approves. You may shower 24 hours after your procedure. When you shower, gently rinse the scrotum and pat it dry. Do not scrub your scrotum. Incision care  Check your incision area every day for signs of infection. Check for: More redness, swelling, or pain. Fluid or blood. Warmth. Pus or a bad smell. Leave stitches (sutures) in place. They may need to stay in place for 2 weeks or longer. Usually, the sutures will dissolve and will not need to be removed. Managing pain, stiffness, and swelling If directed, raise (elevate) your scrotum and apply ice. To do this: Put ice in a plastic bag. Place a small towel or  pillow between your legs. Rest your scrotum on the pillow or towel. Place another towel between your skin and the plastic bag. Leave the ice on for 20 minutes, 2-3 times a day. Remove the ice if your skin turns bright red. This is very important. If you cannot feel pain, heat, or cold, you have a greater risk of damage to the area. Activity If you were given a sedative during the procedure, it can affect you for several hours. Do not drive or operate machinery until your health care provider says that it is safe. Rest as told by your health care provider. Do not do any sports for about 2 weeks. Do not have sex until your health care provider says it is okay. Return to your normal activities as told by your health care provider. Ask your health care provider what activities are safe for you. General instructions Do not use any products that contain nicotine  or tobacco. These products include cigarettes, chewing tobacco, and vaping devices, such as e-cigarettes. These can delay incision healing after surgery. If you need help quitting, ask your health care provider. Use scrotal support, such as a jockstrap or underwear with a supportive pouch, as told by your health care provider. Keep all follow-up visits. This is important. Contact a health care provider if: You continue to have increased swelling or bruising after 3 days. You have more redness, swelling, or pain around your incision. You have fluid or blood coming from your incision. Your incision feels warm to the touch. You have pus or a bad smell coming from your incision. Get help right away if: You have burning or bleeding when you urinate. You cannot urinate. You have a fever or chills. Summary After an epididymectomy, it is common to have pain and swelling in the area where your incision was made. Rest as told by your health care provider. Do not do any sports for about 2 weeks. Do not have sex until your health care provider says  it is okay. Return to your normal activities as told by your health care provider. Ask your health care provider what activities are safe for you. Get help right away if you cannot urinate. This information is not intended to replace advice given to you by your health care provider. Make sure you discuss any questions you have with your health care provider. Document Revised: 07/29/2020 Document Reviewed: 07/29/2020 Elsevier Patient Education  2024 [employer].General Anesthesia, Adult, Care After The following information offers guidance on how to care for yourself after your procedure. Your health care provider may also give you more specific instructions. If you have problems or questions, contact your health care provider. What can I expect after the procedure? After the procedure, it is common for people to: Have pain or  discomfort at the IV site. Have nausea or vomiting. Have a sore throat or hoarseness. Have trouble concentrating. Feel cold or chills. Feel weak, sleepy, or tired (fatigue). Have soreness and body aches. These can affect parts of the body that were not involved in surgery. Follow these instructions at home: For the time period you were told by your health care provider:  Rest. Do not participate in activities where you could fall or become injured. Do not drive or use machinery. Do not drink alcohol . Do not take sleeping pills or medicines that cause drowsiness. Do not make important decisions or sign legal documents. Do not take care of children on your own. General instructions Drink enough fluid to keep your urine pale yellow. If you have sleep apnea, surgery and certain medicines can increase your risk for breathing problems. Follow instructions from your health care provider about wearing your sleep device: Anytime you are sleeping, including during daytime naps. While taking prescription pain medicines, sleeping medicines, or medicines that make you  drowsy. Return to your normal activities as told by your health care provider. Ask your health care provider what activities are safe for you. Take over-the-counter and prescription medicines only as told by your health care provider. Do not use any products that contain nicotine  or tobacco. These products include cigarettes, chewing tobacco, and vaping devices, such as e-cigarettes. These can delay incision healing after surgery. If you need help quitting, ask your health care provider. Contact a health care provider if: You have nausea or vomiting that does not get better with medicine. You vomit every time you eat or drink. You have pain that does not get better with medicine. You cannot urinate or have bloody urine. You develop a skin rash. You have a fever. Get help right away if: You have trouble breathing. You have chest pain. You vomit blood. These symptoms may be an emergency. Get help right away. Call 911. Do not wait to see if the symptoms will go away. Do not drive yourself to the hospital. Summary After the procedure, it is common to have a sore throat, hoarseness, nausea, vomiting, or to feel weak, sleepy, or fatigue. For the time period you were told by your health care provider, do not drive or use machinery. Get help right away if you have difficulty breathing, have chest pain, or vomit blood. These symptoms may be an emergency. This information is not intended to replace advice given to you by your health care provider. Make sure you discuss any questions you have with your health care provider. Document Revised: 03/19/2021 Document Reviewed: 03/19/2021 Elsevier Patient Education  2024 Elsevier Inc.How to Use Chlorhexidine  at Home in the Shower Chlorhexidine  gluconate (CHG) is a germ-killing (antiseptic) wash that's used to clean the skin. It can get rid of the germs that normally live on the skin and can keep them away for about 24 hours. If you're having surgery, you  may be told to shower with CHG at home the night before surgery. This can help lower your risk for infection. To use CHG wash in the shower, follow the steps below. Supplies needed: CHG body wash. Clean washcloth. Clean towel. How to use CHG in the shower Follow these steps unless you're told to use CHG in a different way: Start the shower. Use your normal soap and shampoo to wash your face and hair. Turn off the shower or move out of the shower stream. Pour CHG onto a clean washcloth. Do not use any type  of brush or rough sponge. Start at your neck, washing your body down to your toes. Make sure you: Wash the part of your body where the surgery will be done for at least 1 minute. Do not scrub. Do not use CHG on your head or face unless your health care provider tells you to. If it gets into your ears or eyes, rinse them well with water. Do not wash your genitals with CHG. Wash your back and under your arms. Make sure to wash skin folds. Let the CHG sit on your skin for 1-2 minutes or as long as told. Rinse your entire body in the shower, including all body creases and folds. Turn off the shower. Dry off with a clean towel. Do not put anything on your skin afterward, such as powder, lotion, or perfume. Put on clean clothes or pajamas. If it's the night before surgery, sleep in clean sheets. General tips Use CHG only as told, and follow the instructions on the label. Use the full amount of CHG as told. This is often one bottle. Do not smoke and stay away from flames after using CHG. Your skin may feel sticky after using CHG. This is normal. The sticky feeling will go away as the CHG dries. Do not use CHG: If you have a chlorhexidine  allergy or have reacted to chlorhexidine  in the past. On open wounds or areas of skin that have broken skin, cuts, or scrapes. On babies younger than 74 months of age. Contact a health care provider if: You have questions about using CHG. Your skin gets  irritated or itchy. You have a rash after using CHG. You swallow any CHG. Call your local poison control center [phone] in the [place] Your eyes itch badly, or they become very red or swollen. Your hearing changes. You have trouble seeing. If you can't reach your provider, go to an urgent care or emergency room. Do not drive yourself. Get help right away if: You have swelling or tingling in your mouth or throat. You make high-pitched whistling sounds when you breathe, most often when you breathe out (wheeze). You have trouble breathing. These symptoms may be an emergency. Call 911 right away. Do not wait to see if the symptoms will go away. Do not drive yourself to the hospital. This information is not intended to replace advice given to you by your health care provider. Make sure you discuss any questions you have with your health care provider. Document Revised: 07/05/2022 Document Reviewed: 07/01/2021 Elsevier Patient Education  2024 [employer].

## 2023-06-06 ENCOUNTER — Encounter (HOSPITAL_COMMUNITY)
Admission: RE | Admit: 2023-06-06 | Discharge: 2023-06-06 | Disposition: A | Discharge status: Home / Self Care | Source: Ambulatory Visit | Attending: Urology | Admitting: Urology

## 2023-06-06 ENCOUNTER — Encounter (HOSPITAL_COMMUNITY): Payer: Self-pay

## 2023-06-06 ENCOUNTER — Other Ambulatory Visit: Payer: Self-pay

## 2023-06-06 VITALS — BP 127/85 | HR 96 | Temp 98.0°F | Resp 18 | Ht 72.0 in | Wt 214.9 lb

## 2023-06-06 DIAGNOSIS — F17210 Nicotine dependence, cigarettes, uncomplicated: Secondary | ICD-10-CM | POA: Insufficient documentation

## 2023-06-06 DIAGNOSIS — Z87891 Personal history of nicotine dependence: Secondary | ICD-10-CM | POA: Diagnosis not present

## 2023-06-06 DIAGNOSIS — F17200 Nicotine dependence, unspecified, uncomplicated: Secondary | ICD-10-CM

## 2023-06-06 DIAGNOSIS — N4341 Spermatocele of epididymis, single: Secondary | ICD-10-CM | POA: Diagnosis not present

## 2023-06-06 DIAGNOSIS — N503 Cyst of epididymis: Secondary | ICD-10-CM | POA: Diagnosis not present

## 2023-06-06 DIAGNOSIS — N433 Hydrocele, unspecified: Secondary | ICD-10-CM | POA: Diagnosis not present

## 2023-06-06 DIAGNOSIS — R519 Headache, unspecified: Secondary | ICD-10-CM | POA: Diagnosis not present

## 2023-06-06 DIAGNOSIS — I10 Essential (primary) hypertension: Secondary | ICD-10-CM | POA: Diagnosis not present

## 2023-06-06 DIAGNOSIS — Z01810 Encounter for preprocedural cardiovascular examination: Secondary | ICD-10-CM | POA: Insufficient documentation

## 2023-06-06 HISTORY — DX: Essential (primary) hypertension: I10

## 2023-06-08 ENCOUNTER — Ambulatory Visit (HOSPITAL_COMMUNITY): Admitting: Anesthesiology

## 2023-06-08 ENCOUNTER — Encounter (HOSPITAL_COMMUNITY): Payer: Self-pay | Admitting: Urology

## 2023-06-08 ENCOUNTER — Encounter (HOSPITAL_COMMUNITY)
Admission: RE | Disposition: A | Discharge status: Home / Self Care | Payer: Self-pay | Source: Home / Self Care | Attending: Urology

## 2023-06-08 ENCOUNTER — Ambulatory Visit (HOSPITAL_COMMUNITY)
Admission: RE | Admit: 2023-06-08 | Discharge: 2023-06-08 | Disposition: A | Discharge status: Home / Self Care | Attending: Urology | Admitting: Urology

## 2023-06-08 DIAGNOSIS — I10 Essential (primary) hypertension: Secondary | ICD-10-CM | POA: Diagnosis not present

## 2023-06-08 DIAGNOSIS — N4340 Spermatocele of epididymis, unspecified: Secondary | ICD-10-CM | POA: Diagnosis not present

## 2023-06-08 DIAGNOSIS — N503 Cyst of epididymis: Secondary | ICD-10-CM | POA: Diagnosis not present

## 2023-06-08 DIAGNOSIS — N4341 Spermatocele of epididymis, single: Secondary | ICD-10-CM | POA: Diagnosis not present

## 2023-06-08 DIAGNOSIS — N4342 Spermatocele of epididymis, multiple: Secondary | ICD-10-CM

## 2023-06-08 DIAGNOSIS — N433 Hydrocele, unspecified: Secondary | ICD-10-CM | POA: Insufficient documentation

## 2023-06-08 DIAGNOSIS — R519 Headache, unspecified: Secondary | ICD-10-CM | POA: Diagnosis not present

## 2023-06-08 DIAGNOSIS — Z87891 Personal history of nicotine dependence: Secondary | ICD-10-CM | POA: Diagnosis not present

## 2023-06-08 HISTORY — PX: SPERMATOCELECTOMY: SHX2420

## 2023-06-08 SURGERY — EXCISION, SPERMATOCELE
Anesthesia: General | Site: Scrotum | Laterality: Left

## 2023-06-08 MED ORDER — PROPOFOL 10 MG/ML IV BOLUS
INTRAVENOUS | Status: AC
Start: 2023-06-08 — End: ?
  Filled 2023-06-08: qty 20

## 2023-06-08 MED ORDER — CHLORHEXIDINE GLUCONATE 0.12 % MT SOLN
OROMUCOSAL | Status: AC
  Filled 2023-06-08: qty 15

## 2023-06-08 MED ORDER — BUPIVACAINE HCL (PF) 0.25 % IJ SOLN
INTRAMUSCULAR | Status: AC
  Filled 2023-06-08: qty 30

## 2023-06-08 MED ORDER — FENTANYL CITRATE PF 50 MCG/ML IJ SOSY: 25.0000 ug | PREFILLED_SYRINGE | INTRAMUSCULAR | Status: DC | PRN

## 2023-06-08 MED ORDER — LIDOCAINE 2% (20 MG/ML) 5 ML SYRINGE
INTRAMUSCULAR | Status: DC | PRN
Start: 2023-06-08 — End: 2023-06-08
  Administered 2023-06-08: 100 mg via INTRAVENOUS

## 2023-06-08 MED ORDER — MIDAZOLAM HCL 2 MG/2ML IJ SOLN
INTRAMUSCULAR | Status: DC | PRN
  Administered 2023-06-08: 2 mg via INTRAVENOUS

## 2023-06-08 MED ORDER — FENTANYL CITRATE (PF) 100 MCG/2ML IJ SOLN
INTRAMUSCULAR | Status: AC
  Filled 2023-06-08: qty 2

## 2023-06-08 MED ORDER — DEXAMETHASONE SODIUM PHOSPHATE 10 MG/ML IJ SOLN
INTRAMUSCULAR | Status: AC
Start: 2023-06-08 — End: ?
  Filled 2023-06-08: qty 1

## 2023-06-08 MED ORDER — PROPOFOL 10 MG/ML IV BOLUS
INTRAVENOUS | Status: AC
  Filled 2023-06-08: qty 20

## 2023-06-08 MED ORDER — DEXMEDETOMIDINE HCL IN NACL 80 MCG/20ML IV SOLN
INTRAVENOUS | Status: DC | PRN
  Administered 2023-06-08: 20 ug via INTRAVENOUS

## 2023-06-08 MED ORDER — CEFAZOLIN SODIUM-DEXTROSE 2-4 GM/100ML-% IV SOLN
INTRAVENOUS | Status: AC
  Filled 2023-06-08: qty 100

## 2023-06-08 MED ORDER — 0.9 % SODIUM CHLORIDE (POUR BTL) OPTIME
TOPICAL | Status: DC | PRN
  Administered 2023-06-08: 1000 mL

## 2023-06-08 MED ORDER — LIDOCAINE 2% (20 MG/ML) 5 ML SYRINGE
INTRAMUSCULAR | Status: AC
  Filled 2023-06-08: qty 5

## 2023-06-08 MED ORDER — ONDANSETRON HCL 4 MG/2ML IJ SOLN
INTRAMUSCULAR | Status: DC | PRN
Start: 2023-06-08 — End: 2023-06-08
  Administered 2023-06-08: 4 mg via INTRAVENOUS

## 2023-06-08 MED ORDER — KETOROLAC TROMETHAMINE 10 MG PO TABS: 10.0000 mg | ORAL_TABLET | Freq: Four times a day (QID) | ORAL | 0 refills | Status: DC | PRN

## 2023-06-08 MED ORDER — FENTANYL CITRATE (PF) 100 MCG/2ML IJ SOLN
INTRAMUSCULAR | Status: DC | PRN
  Administered 2023-06-08 (×4): 50 ug via INTRAVENOUS

## 2023-06-08 MED ORDER — DEXAMETHASONE SODIUM PHOSPHATE 10 MG/ML IJ SOLN
INTRAMUSCULAR | Status: DC | PRN
Start: 2023-06-08 — End: 2023-06-08
  Administered 2023-06-08: 10 mg via INTRAVENOUS

## 2023-06-08 MED ORDER — CHLORHEXIDINE GLUCONATE 0.12 % MT SOLN
15.0000 mL | Freq: Once | OROMUCOSAL | Status: AC
  Administered 2023-06-08: 15 mL via OROMUCOSAL

## 2023-06-08 MED ORDER — OXYCODONE-ACETAMINOPHEN 5-325 MG PO TABS: 1.0000 | ORAL_TABLET | ORAL | 0 refills | Status: DC | PRN

## 2023-06-08 MED ORDER — CEFAZOLIN SODIUM-DEXTROSE 2-4 GM/100ML-% IV SOLN
2.0000 g | INTRAVENOUS | Status: AC
  Administered 2023-06-08: 2 g via INTRAVENOUS

## 2023-06-08 MED ORDER — ONDANSETRON HCL 4 MG/2ML IJ SOLN
INTRAMUSCULAR | Status: AC
  Filled 2023-06-08: qty 2

## 2023-06-08 MED ORDER — OXYCODONE HCL 5 MG PO TABS
5.0000 mg | ORAL_TABLET | Freq: Once | ORAL | Status: AC | PRN
  Administered 2023-06-08: 5 mg via ORAL
  Filled 2023-06-08: qty 1

## 2023-06-08 MED ORDER — ORAL CARE MOUTH RINSE: 15.0000 mL | Freq: Once | OROMUCOSAL | Status: AC

## 2023-06-08 MED ORDER — BUPIVACAINE HCL (PF) 0.25 % IJ SOLN
INTRAMUSCULAR | Status: DC | PRN
  Administered 2023-06-08: 10 mL

## 2023-06-08 MED ORDER — ONDANSETRON HCL 4 MG/2ML IJ SOLN: 4.0000 mg | Freq: Once | INTRAMUSCULAR | Status: DC | PRN

## 2023-06-08 MED ORDER — MIDAZOLAM HCL 2 MG/2ML IJ SOLN
INTRAMUSCULAR | Status: AC
  Filled 2023-06-08: qty 2

## 2023-06-08 MED ORDER — PROPOFOL 500 MG/50ML IV EMUL
INTRAVENOUS | Status: DC | PRN
  Administered 2023-06-08: 50 mg via INTRAVENOUS
  Administered 2023-06-08: 280 mg via INTRAVENOUS

## 2023-06-08 MED ORDER — OXYCODONE HCL 5 MG/5ML PO SOLN: 5.0000 mg | Freq: Once | ORAL | Status: AC | PRN

## 2023-06-08 MED ORDER — LACTATED RINGERS IV SOLN: INTRAVENOUS | Status: DC

## 2023-06-08 SURGICAL SUPPLY — 29 items
CLOTH BEACON ORANGE TIMEOUT ST (SAFETY) ×1 IMPLANT
COVER LIGHT HANDLE STERIS (MISCELLANEOUS) ×2 IMPLANT
DERMABOND ADVANCED .7 DNX12 (GAUZE/BANDAGES/DRESSINGS) IMPLANT
DRAIN PENROSE 0.5X18 (DRAIN) ×1 IMPLANT
ELECT NDL BLADE 2-5/6 (NEEDLE) IMPLANT
ELECT NEEDLE BLADE 2-5/6 (NEEDLE) ×1 IMPLANT
ELECTRODE REM PT RTRN 9FT ADLT (ELECTROSURGICAL) ×1 IMPLANT
GAUZE SPONGE 4X4 12PLY STRL LF (GAUZE/BANDAGES/DRESSINGS) ×1 IMPLANT
GLOVE BIO SURGEON STRL SZ7 (GLOVE) IMPLANT
GLOVE BIO SURGEON STRL SZ8 (GLOVE) ×1 IMPLANT
GLOVE BIOGEL PI IND STRL 7.0 (GLOVE) ×2 IMPLANT
GLOVE BIOGEL PI IND STRL 8 (GLOVE) ×1 IMPLANT
GLOVE ECLIPSE 6.5 STRL STRAW (GLOVE) IMPLANT
GOWN STRL REUS W/TWL LRG LVL3 (GOWN DISPOSABLE) ×1 IMPLANT
GOWN STRL REUS W/TWL XL LVL3 (GOWN DISPOSABLE) ×1 IMPLANT
KIT TURNOVER KIT A (KITS) ×1 IMPLANT
MANIFOLD NEPTUNE II (INSTRUMENTS) IMPLANT
NDL HYPO 25X1 1.5 SAFETY (NEEDLE) ×1 IMPLANT
NEEDLE HYPO 25X1 1.5 SAFETY (NEEDLE) ×1 IMPLANT
NS IRRIG 1000ML POUR BTL (IV SOLUTION) IMPLANT
PACK MINOR (CUSTOM PROCEDURE TRAY) ×1 IMPLANT
PAD ARMBOARD POSITIONER FOAM (MISCELLANEOUS) ×1 IMPLANT
POSITIONER HEAD 8X9X4 ADT (SOFTGOODS) ×1 IMPLANT
SET BASIN LINEN APH (SET/KITS/TRAYS/PACK) ×1 IMPLANT
SUPPORT SCROTAL LG STRP (MISCELLANEOUS) ×1 IMPLANT
SUT MNCRL AB 4-0 PS2 18 (SUTURE) ×1 IMPLANT
SUT VIC AB 2-0 CT1 TAPERPNT 27 (SUTURE) ×1 IMPLANT
SUT VIC AB 3-0 SH 27X BRD (SUTURE) ×1 IMPLANT
SYR CONTROL 10ML LL (SYRINGE) ×1 IMPLANT

## 2023-06-08 NOTE — Op Note (Signed)
 Preoperative diagnosis: left epididymal cyst  Postoperative diagnosis: left epididymal cyst  Procedure: 1. Excision of left epididymal cyst  Attending: Johnie Nailer, MD  Anesthesia: General  History of blood loss: Minimal  Antibiotics: ancef   Drains: none  Specimens: 1. left epididymal cyst x 2  Findings: 3.5cm and 1.5cm right epididymal cysts, small left hydrocele  Indications: Patient is a 43 year old male with a history of left epididymal cyst that was growing in size and causing him pain with walking.  We discussed the treatment options including observation versus excision after discussing treatment options he decided to proceed with excision.   Procedure in detail: Prior to procedure consent was obtained.  Patient was brought to the operating room and a brief timeout was done to ensure correct patient, correct procedure, correct site.  General anesthesia was administered and patient was placed in supine position.  His genitalia was then prepped and draped in usual sterile fashion.  A 3 cm incision was made in the left hemiscrotum.  We dissected down to the tunica and then incised the tunica. A small hydrocele was noted and drained. We then used sharp dissection to excision the right epididymal cyst intact. This was then sent for pathology. Hemostasis was then obtained with electrocautery. We then closed the defect in the epididymis with 3-0 vicryl in a running fashion. We then returned the testis to the right hemiscrotum and closed the overlying dartos with 3-0 vicryl in a running fashion. The skin was then closed with 3-0 chromic in a running fashion. Dermabond was placed on the incision.  A dressing was then applied to the incision.  We then placed a scrotal fluff and this then concluded the procedure which was well tolerated by the patient.  Complications: None  Condition: Stable, extubated, transferred to PACU.  Plan: Patient is to be discharged home.  He is to follow up in  2 weeks for wound check.

## 2023-06-08 NOTE — H&P (Signed)
 HPI: Daniel Hays is a 42yo here for left epididymal cyst excision. Starting 2 months ago he noted testicular masses. He underwent scrotal US  which showed bilateral 1.5-3.1cm epididymal cysts. He has intermittent testicular pain with activity. No significant LUTS.      PMH:     Past Medical History:  Diagnosis Date   Chronic knee pain     Headache(784.0)            Surgical History:      Past Surgical History:  Procedure Laterality Date   APPENDECTOMY       COLONOSCOPY        Hx: of   KNEE BURSECTOMY Left 08/21/2015    Procedure: KNEE BURSECTOMY;  Surgeon: Darrin Emerald, MD;  Location: [facility];  Service: Orthopedics;  Laterality: Left;  PREPATELLAR   ORIF WRIST FRACTURE Right 10/13/2012    Procedure: OPEN REDUCTION INTERNAL FIXATION (ORIF)RIGHT 5TH CARPAL METACARPAL FRACTURE HAND  ;  Surgeon: Ronn Cohn, MD;  Location: [facility];  Service: Orthopedics;  Laterality: Right;          Home Medications:  Allergies as of 05/05/2023         Reactions    Avelox [moxifloxacin Hcl In Nacl]      Hives,cold sweats            Medication List           Accurate as of May 05, 2023 11:58 AM. If you have any questions, ask your nurse or doctor.              amLODipine 5 MG tablet Commonly known as: NORVASC Take 5 mg by mouth daily.    APO-Varenicline 0.5 MG tablet Generic drug: varenicline Take 0.5 mg by mouth in the morning and at bedtime.    BC HEADACHE POWDER PO Take by mouth daily as needed.    busPIRone 5 MG tablet Commonly known as: BUSPAR Take 5 mg by mouth 2 (two) times daily.    DULoxetine  60 MG capsule Commonly known as: Cymbalta  Take 1 capsule (60 mg total) by mouth daily.    gabapentin  300 MG capsule Commonly known as: NEURONTIN  Take 1 capsule (300 mg total) by mouth 3 (three) times daily.    meloxicam  15 MG tablet Commonly known as: Mobic  Take 1 tablet (15 mg total) by mouth daily as needed for pain.    methylPREDNISolone  4 MG Tbpk tablet Commonly  known as: MEDROL  DOSEPAK Tapering dose per instruction    naproxen  500 MG tablet Commonly known as: NAPROSYN  Take 1 tablet (500 mg total) by mouth 2 (two) times daily with a meal.    ondansetron  8 MG disintegrating tablet Commonly known as: ZOFRAN -ODT Take 8 mg by mouth every 8 (eight) hours as needed for nausea or vomiting.    pantoprazole  40 MG tablet Commonly known as: PROTONIX  Take 40 mg by mouth daily.    UNABLE TO FIND Med Name: Methadone liquid             Allergies:  Allergies       Allergies  Allergen Reactions   Avelox [Moxifloxacin Hcl In Nacl]        Hives,cold sweats        Family History:      Family History  Problem Relation Age of Onset   Cancer - Other Other            Social History:  reports that he has been smoking cigarettes. He has a 16 pack-year  smoking history. He has never used smokeless tobacco. He reports that he does not currently use alcohol . He reports that he does not currently use drugs.   ROS: All other review of systems were reviewed and are negative except what is noted above in HPI   Physical Exam: BP (!) 151/105   Pulse 80   Constitutional:  Alert and oriented, No acute distress. HEENT: [place] AT, moist mucus membranes.  Trachea midline, no masses. Cardiovascular: No clubbing, cyanosis, or edema. Respiratory: Normal respiratory effort, no increased work of breathing. GI: Abdomen is soft, nontender, nondistended, no abdominal masses GU: No CVA tenderness. Circumcised phallus. 3cm left epididymal head cyst, mildly tender Lymph: No cervical or inguinal lymphadenopathy. Skin: No rashes, bruises or suspicious lesions. Neurologic: Grossly intact, no focal deficits, moving all 4 extremities. Psychiatric: Normal mood and affect.   Laboratory Data: Recent Labs       Lab Results  Component Value Date    WBC 8.9 08/19/2015    HGB 15.5 08/19/2015    HCT 46.2 08/19/2015    MCV 94.3 08/19/2015    PLT 289 08/19/2015         Recent Labs       Lab Results  Component Value Date    CREATININE 0.82 08/19/2015        Recent Labs  No results found for: "PSA"     Recent Labs  No results found for: "TESTOSTERONE"     Recent Labs  No results found for: "HGBA1C"     Urinalysis Labs (Brief)          Component Value Date/Time    COLORURINE YELLOW 07/24/2010 2229    APPEARANCEUR CLEAR 07/24/2010 2229    LABSPEC 1.029 07/24/2010 2229    PHURINE 6.0 07/24/2010 2229    GLUCOSEU NEGATIVE 07/24/2010 2229    HGBUR NEGATIVE 07/24/2010 2229    BILIRUBINUR NEGATIVE 07/24/2010 2229    KETONESUR NEGATIVE 07/24/2010 2229    PROTEINUR NEGATIVE 07/24/2010 2229    UROBILINOGEN 0.2 07/24/2010 2229    NITRITE NEGATIVE 07/24/2010 2229    LEUKOCYTESUR NEGATIVE 07/24/2010 2229        Recent Labs  No results found for: "LABMICR", "WBCUA", "RBCUA", "LABEPIT", "MUCUS", "BACTERIA"     Pertinent Imaging: Scrotal US  04/07/2023: Images reviewed and discussed with the patient  No results found for this or any previous visit.   No results found for this or any previous visit.   No results found for this or any previous visit.   No results found for this or any previous visit.   No results found for this or any previous visit.   No results found for this or any previous visit.   No results found for this or any previous visit.   No results found for this or any previous visit.     Assessment & Plan:     1. Pain in both testicles The risks/benefits/alternatives to left epididymal cyst excision was explained to the patient and he udnerstands and wishes to proceed with surgery

## 2023-06-08 NOTE — Anesthesia Preprocedure Evaluation (Signed)
 Anesthesia Evaluation  Patient identified by MRN, date of birth, ID band Patient awake    Reviewed: Allergy & Precautions, H&P , NPO status , Patient's Chart, lab work & pertinent test results, reviewed documented beta blocker date and time   Airway Mallampati: II  TM Distance: >3 FB Neck ROM: full    Dental no notable dental hx.    Pulmonary neg pulmonary ROS, Patient abstained from smoking., former smoker   Pulmonary exam normal breath sounds clear to auscultation       Cardiovascular Exercise Tolerance: Good hypertension,  Rhythm:regular Rate:Normal     Neuro/Psych  Headaches  Neuromuscular disease  negative psych ROS   GI/Hepatic negative GI ROS, Neg liver ROS,,,  Endo/Other  negative endocrine ROS    Renal/GU negative Renal ROS  negative genitourinary   Musculoskeletal   Abdominal   Peds  Hematology negative hematology ROS (+)   Anesthesia Other Findings   Reproductive/Obstetrics negative OB ROS                             Anesthesia Physical Anesthesia Plan  ASA: 2  Anesthesia Plan: General and General LMA   Post-op Pain Management:    Induction:   PONV Risk Score and Plan: Ondansetron   Airway Management Planned:   Additional Equipment:   Intra-op Plan:   Post-operative Plan:   Informed Consent: I have reviewed the patients History and Physical, chart, labs and discussed the procedure including the risks, benefits and alternatives for the proposed anesthesia with the patient or authorized representative who has indicated his/her understanding and acceptance.     Dental Advisory Given  Plan Discussed with: CRNA  Anesthesia Plan Comments:        Anesthesia Quick Evaluation

## 2023-06-08 NOTE — Anesthesia Postprocedure Evaluation (Signed)
 Anesthesia Post Note  Patient: Daniel Hays  Procedure(s) Performed: EXCISION, SPERMATOCELE (Left: Scrotum)  Patient location during evaluation: Phase II Anesthesia Type: General Level of consciousness: awake Pain management: pain level controlled Vital Signs Assessment: post-procedure vital signs reviewed and stable Respiratory status: spontaneous breathing and respiratory function stable Cardiovascular status: blood pressure returned to baseline and stable Postop Assessment: no headache and no apparent nausea or vomiting Anesthetic complications: no Comments: Late entry   No notable events documented.   Last Vitals:  Vitals:   06/08/23 0947 06/08/23 1004  BP: (!) 161/114 (!) 140/113  Pulse: 73 78  Resp: 15 16  Temp:  (!) 36.3 C  SpO2: 95% 97%    Last Pain:  Vitals:   06/08/23 1004  TempSrc: Oral  PainSc: 3                  Coretha Dew

## 2023-06-08 NOTE — Anesthesia Procedure Notes (Addendum)
 Procedure Name: LMA Insertion Date/Time: 06/08/2023 8:51 AM  Performed by: Coretha Dew, MDPre-anesthesia Checklist: Patient identified, Emergency Drugs available, Suction available and Patient being monitored Patient Re-evaluated:Patient Re-evaluated prior to induction Oxygen Delivery Method: Circle system utilized Preoxygenation: Pre-oxygenation with 100% oxygen Induction Type: IV induction Ventilation: Mask ventilation without difficulty LMA: LMA inserted LMA Size: 5.0 Placement Confirmation: positive ETCO2, CO2 detector and breath sounds checked- equal and bilateral Tube secured with: Tape Dental Injury: Teeth and Oropharynx as per pre-operative assessment  Comments: LMA size 5 placed by Missy Amos, SRNA. Lips and teeth remain in preoperative condition.

## 2023-06-08 NOTE — Transfer of Care (Addendum)
 Immediate Anesthesia Transfer of Care Note  Patient: Daniel Hays  Procedure(s) Performed: EXCISION, SPERMATOCELE (Left: Scrotum)  Patient Location: PACU  Anesthesia Type:General  Level of Consciousness: awake and drowsy  Airway & Oxygen Therapy: Patient Spontanous Breathing and Patient connected to face mask oxygen  Post-op Assessment: Report given to RN and Post -op Vital signs reviewed and stable  Post vital signs: Reviewed and stable  Last Vitals:  Vitals Value Taken Time  BP 154/112 06/08/23   0935  Temp 36.4 06/08/23   0935  Pulse 68 06/08/23   0935  Resp 14 06/08/23   0935  SpO2 100% 06/08/23   0935    Last Pain:  Vitals:   06/08/23 0800  PainSc: 0-No pain         Complications: No notable events documented.

## 2023-06-09 ENCOUNTER — Encounter (HOSPITAL_COMMUNITY): Payer: Self-pay | Admitting: Urology

## 2023-06-09 LAB — SURGICAL PATHOLOGY

## 2023-06-13 ENCOUNTER — Telehealth: Payer: Self-pay

## 2023-06-13 NOTE — Telephone Encounter (Signed)
 Patient called left vm about having a hard time with pain and swelling . Patient state's Dr. Claretta Croft prescribe him Ketorolac  for pain and it is not helping with pain. Patient is requesting stronger pain medication to help with pain and swelling. Patient is aware a message will be to MD for advisement. Patient voiced understanding.

## 2023-06-16 ENCOUNTER — Other Ambulatory Visit: Payer: Self-pay

## 2023-06-16 DIAGNOSIS — N5082 Scrotal pain: Secondary | ICD-10-CM

## 2023-06-16 MED ORDER — KETOROLAC TROMETHAMINE 10 MG PO TABS: 10.0000 mg | ORAL_TABLET | Freq: Four times a day (QID) | ORAL | 0 refills | Status: AC | PRN

## 2023-06-16 NOTE — Telephone Encounter (Signed)
 Patient called again he is having a lot of pain and he is really bruised, he can't get off the couch he is in a lot of pain.  He would like you to call him in something until he sees you on Wednesday  He uses [facility]  - [address]

## 2023-06-16 NOTE — Telephone Encounter (Signed)
 Patient is made aware Toradol  has been sent to his pharmacy. Patient state's that what he was given at the hospital when he was discharged. Patient is made aware I am not authorized to send in opioids and Per Dr. Claretta Croft Toradol  can be sent to patient pharmacy. Patient voiced understanding.

## 2023-06-20 NOTE — Progress Notes (Signed)
 Name: Daniel Hays DOB: 1980-08-22 MRN: [id number]  Diagnoses: Post-operative state  HPI: He presents postoperatively.  He underwent the following procedures by Dr. Claretta Croft on 06/08/2023:  Preoperative diagnosis: left epididymal cyst   Postoperative diagnosis: left epididymal cyst   Procedure: 1. Excision of left epididymal cyst  Pathology:  A. EPIDIDYMAL CYST, LEFT, EXCISION:  Spermatocele   Postop course: Today: He reports some ongoing scrotal pain and swelling. He states he is managing adequately with Toradol  and OTC analgesics. Denies fevers, redness, warmth, tenderness, or drainage at the incision site(s). He denies any acute urinary complaints.   Medications: Current Outpatient Medications  Medication Sig Dispense Refill   amLODipine (NORVASC) 5 MG tablet Take 5 mg by mouth daily.     DULoxetine  (CYMBALTA ) 30 MG capsule Take 30 mg by mouth daily.     ketorolac  (TORADOL ) 10 MG tablet Take 1 tablet (10 mg total) by mouth every 6 (six) hours as needed. 15 tablet 0   pantoprazole  (PROTONIX ) 40 MG tablet Take 40 mg by mouth daily.     No current facility-administered medications for this visit.    Allergies: Allergies  Allergen Reactions   Avelox [Moxifloxacin Hcl In Nacl]     Hives,cold sweats    Past Medical History:  Diagnosis Date   Chronic knee pain    Headache(784.0)    Hypertension    Past Surgical History:  Procedure Laterality Date   APPENDECTOMY     COLONOSCOPY     Hx: of   KNEE BURSECTOMY Left 08/21/2015   Procedure: KNEE BURSECTOMY;  Surgeon: Darrin Emerald, MD;  Location: [facility];  Service: Orthopedics;  Laterality: Left;  PREPATELLAR   ORIF WRIST FRACTURE Right 10/13/2012   Procedure: OPEN REDUCTION INTERNAL FIXATION (ORIF)RIGHT 5TH CARPAL METACARPAL FRACTURE HAND  ;  Surgeon: Ronn Cohn, MD;  Location: [facility];  Service: Orthopedics;  Laterality: Right;   SPERMATOCELECTOMY Left 06/08/2023   Procedure: EXCISION, SPERMATOCELE;  Surgeon:  Marco Severs, MD;  Location: [facility];  Service: Urology;  Laterality: Left;   Family History  Problem Relation Age of Onset   Cancer - Other Other    Social History   Socioeconomic History   Marital status: Single    Spouse name: Not on file   Number of children: Not on file   Years of education: Not on file   Highest education level: Not on file  Occupational History   Not on file  Tobacco Use   Smoking status: Former    Current packs/day: 1.00    Average packs/day: 1 pack/day for 16.0 years (16.0 ttl pk-yrs)    Types: Cigarettes   Smokeless tobacco: Never  Substance and Sexual Activity   Alcohol  use: Not Currently    Comment: occasionally   Drug use: Not Currently    Types: Marijuana    Comment: daily   Sexual activity: Yes    Birth control/protection: None  Other Topics Concern   Not on file  Social History Narrative   Lives with son 60 y.o.   Right handed   Caffeine: coffee 2-3 cups/day   Social Drivers of Corporate investment banker Strain: Not on file  Food Insecurity: Not on file  Transportation Needs: Not on file  Physical Activity: Not on file  Stress: Not on file  Social Connections: Not on file  Intimate Partner Violence: Not on file    SUBJECTIVE  Review of Systems Constitutional: Patient denies any unintentional weight loss or change in strength  lntegumentary: Patient denies any rashes or pruritus Cardiovascular: Patient denies chest pain or syncope Respiratory: Patient denies shortness of breath Gastrointestinal: Patient denies nausea, vomiting, constipation, or diarrhea  Musculoskeletal: Patient denies muscle cramps or weakness Neurologic: Patient denies convulsions or seizures Allergic/Immunologic: Patient denies recent allergic reaction(s) Hematologic/Lymphatic: Patient denies bleeding tendencies Endocrine: Patient denies heat/cold intolerance  GU: As per HPI.  OBJECTIVE Vitals:   06/21/23 0827  BP: (!) 140/105  Pulse: 81    There is no height or weight on file to calculate BMI.  Physical Examination Constitutional: No obvious distress; patient is non-toxic appearing  Cardiovascular: No visible lower extremity edema.  Respiratory: The patient does not have audible wheezing/stridor; respirations do not appear labored  Gastrointestinal: Abdomen non-distended Musculoskeletal: Normal ROM of UEs  Skin: No obvious rashes/open sores  Neurologic: CN 2-12 grossly intact Psychiatric: Answered questions appropriately with normal affect  Hematologic/Lymphatic/Immunologic: No obvious bruises or sites of spontaneous bleeding  GU: Left hemiscrotum indurated with no surrounding erythema, edema, crepitus, fluctuance, drainage / discharge, warmth, significant tenderness to palpation. The incision is healing via primary and secondary intention with intermittent gaps.  ASSESSMENT Spermatocele of epididymis, multiple  Postop check  We reviewed the operative procedures and findings. Surgical site healing appropriately. Advised rest, application of ice, analgesics PRN, scrotal support throughout the day with supportive underwear and/or jock strap, and scrotal elevation when at rest for comfort and to promote drainage. 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 Patient verbalized understanding of and agreement with current plan. All questions were answered.  PLAN Advised the following: Return in 3 weeks (on 07/12/2023), or if symptoms worsen or fail to improve, for repeat wound check w/ NP.   No orders of the defined types were placed in this encounter.   It has been explained that the patient is to follow regularly with their PCP in addition to all other providers involved in their care and to follow instructions provided by these respective offices. Patient advised to contact urology  clinic if any urologic-pertaining questions, concerns, new symptoms or problems arise in the interim period.  There are no Patient Instructions on file for this visit.  Electronically signed by:  Lauretta Ponto, MSN, FNP-C, CUNP 06/21/2023 8:56 AM

## 2023-06-21 ENCOUNTER — Ambulatory Visit: Admitting: Urology

## 2023-06-21 ENCOUNTER — Encounter: Payer: Self-pay | Admitting: Urology

## 2023-06-21 VITALS — BP 140/105 | HR 81

## 2023-06-21 DIAGNOSIS — Z09 Encounter for follow-up examination after completed treatment for conditions other than malignant neoplasm: Secondary | ICD-10-CM

## 2023-06-21 DIAGNOSIS — N4342 Spermatocele of epididymis, multiple: Secondary | ICD-10-CM

## 2023-06-21 DIAGNOSIS — Z87438 Personal history of other diseases of male genital organs: Secondary | ICD-10-CM

## 2023-06-26 ENCOUNTER — Telehealth: Payer: Self-pay

## 2023-06-26 NOTE — Telephone Encounter (Signed)
 Pt stated he came in last week for his pre op appt and everything was fine this weekend in the shower he noticed his wound was opened pt was advised per verbal from MD McKenzie put guaze on it and monitor to blood output pt stated he was bleeding heavy yesterday but as today its a lot less

## 2023-07-18 NOTE — Progress Notes (Unsigned)
 Name: Daniel Hays DOB: 03/08/1980 MRN: [id number]  History of Present Illness: Mr. Sauls is a 43 y.o. male who presents today for follow up visit at [facility] [place].   Recent History: > 06/08/2023: Excision of left epididymal cyst / spermatocele by Dr. Sherrilee.   > 06/21/2023: Postop visit. Reported scrotal pain and swelling. On exam: Left hemiscrotum indurated with no surrounding erythema, edema, crepitus, fluctuance, drainage / discharge, warmth, significant tenderness to palpation. The incision is healing via primary and secondary intention with intermittent gaps.   Today: Denies any relief of the left pelvic / inguinal pressure that he felt preoperatively. He reports persistent left scrotal swelling and pain. Denies scrotal redness or warmth. He is wear a scrotal support most of the time.   He denies acute urinary complaints. He denies fevers.   Medications: Current Outpatient Medications  Medication Sig Dispense Refill   amLODipine (NORVASC) 5 MG tablet Take 5 mg by mouth daily.     DULoxetine  (CYMBALTA ) 30 MG capsule Take 30 mg by mouth daily.     ketorolac  (TORADOL ) 10 MG tablet Take 1 tablet (10 mg total) by mouth every 6 (six) hours as needed. 15 tablet 0   pantoprazole  (PROTONIX ) 40 MG tablet Take 40 mg by mouth daily.     No current facility-administered medications for this visit.    Allergies: Allergies  Allergen Reactions   Avelox [Moxifloxacin Hcl In Nacl]     Hives,cold sweats    Past Medical History:  Diagnosis Date   Chronic knee pain    Headache(784.0)    Hypertension    Past Surgical History:  Procedure Laterality Date   APPENDECTOMY     COLONOSCOPY     Hx: of   KNEE BURSECTOMY Left 08/21/2015   Procedure: KNEE BURSECTOMY;  Surgeon: Taft FORBES Minerva, MD;  Location: [facility];  Service: Orthopedics;  Laterality: Left;  PREPATELLAR   ORIF WRIST FRACTURE Right 10/13/2012   Procedure: OPEN REDUCTION INTERNAL FIXATION (ORIF)RIGHT 5TH  CARPAL METACARPAL FRACTURE HAND  ;  Surgeon: Elsie Mussel, MD;  Location: [facility];  Service: Orthopedics;  Laterality: Right;   SPERMATOCELECTOMY Left 06/08/2023   Procedure: EXCISION, SPERMATOCELE;  Surgeon: Sherrilee Belvie LITTIE, MD;  Location: [facility];  Service: Urology;  Laterality: Left;   Family History  Problem Relation Age of Onset   Cancer - Other Other    Social History   Socioeconomic History   Marital status: Single    Spouse name: Not on file   Number of children: Not on file   Years of education: Not on file   Highest education level: Not on file  Occupational History   Not on file  Tobacco Use   Smoking status: Former    Current packs/day: 1.00    Average packs/day: 1 pack/day for 16.0 years (16.0 ttl pk-yrs)    Types: Cigarettes   Smokeless tobacco: Never  Substance and Sexual Activity   Alcohol  use: Not Currently    Comment: occasionally   Drug use: Not Currently    Types: Marijuana    Comment: daily   Sexual activity: Yes    Birth control/protection: None  Other Topics Concern   Not on file  Social History Narrative   Lives with son 16 y.o.   Right handed   Caffeine: coffee 2-3 cups/day   Social Drivers of Corporate investment banker Strain: Not on file  Food Insecurity: Not on file  Transportation Needs: Not on file  Physical Activity:  Not on file  Stress: Not on file  Social Connections: Not on file  Intimate Partner Violence: Not on file    Review of Systems Constitutional: Patient denies any unintentional weight loss or change in strength lntegumentary: Patient denies any rashes or pruritus Cardiovascular: Patient denies chest pain or syncope Respiratory: Patient denies shortness of breath Gastrointestinal: Patient denies nausea, vomiting, constipation, or diarrhea  Musculoskeletal: Patient denies muscle cramps or weakness Neurologic: Patient denies convulsions or seizures Allergic/Immunologic: Patient denies recent allergic  reaction(s) Hematologic/Lymphatic: Patient denies bleeding tendencies Endocrine: Patient denies heat/cold intolerance  GU: As per HPI.  OBJECTIVE Vitals:   07/19/23 1433  BP: (!) 146/95  Pulse: 76   There is no height or weight on file to calculate BMI.  Physical Examination Constitutional: No obvious distress; patient is non-toxic appearing  Cardiovascular: No visible lower extremity edema.  Respiratory: The patient does not have audible wheezing/stridor; respirations do not appear labored  Gastrointestinal: Abdomen non-distended Musculoskeletal: Normal ROM of UEs  Skin: No obvious rashes/open sores  Neurologic: CN 2-12 grossly intact Psychiatric: Answered questions appropriately with normal affect  Hematologic/Lymphatic/Immunologic: No obvious bruises or sites of spontaneous bleeding  Genitourinary: Left hemiscrotum is indurated at superior/lateral aspect. The incision site is well healed with no discharge or surrounding fluctuance, crepitus, erythema, or warmth. Bilateral testes are normal in size and position bilaterally.  Chaperone offered for pelvic exam; patient declined.   ASSESSMENT Spermatocele of epididymis, multiple  Postop check  Scrotal pain  Epididymal cyst  Patient was reassured there are no acute findings on today's exam. We discussed anticipated healing process can take quite some time for postop scrotal induration to fully resolve. Advised rest, application of ice, analgesics PRN, scrotal support throughout the day with supportive underwear and/or jock strap, and scrotal elevation when at rest for comfort and to promote drainage. OK to return to work. We agreed to plan for follow up in 6 weeks for recheck. Patient verbalized understanding of and agreement with current plan. All questions were answered.  PLAN Advised the following: 1. Return in about 6 weeks (around 08/30/2023).  No orders of the defined types were placed in this encounter.   It has  been explained that the patient is to follow regularly with their PCP in addition to all other providers involved in their care and to follow instructions provided by these respective offices. Patient advised to contact urology clinic if any urologic-pertaining questions, concerns, new symptoms or problems arise in the interim period.  There are no Patient Instructions on file for this visit.  Electronically signed by:  Lauraine JAYSON Oz, FNP   07/19/23    3:07 PM

## 2023-07-19 ENCOUNTER — Ambulatory Visit (INDEPENDENT_AMBULATORY_CARE_PROVIDER_SITE_OTHER): Admitting: Urology

## 2023-07-19 ENCOUNTER — Encounter: Payer: Self-pay | Admitting: Urology

## 2023-07-19 VITALS — BP 146/95 | HR 76

## 2023-07-19 DIAGNOSIS — N5082 Scrotal pain: Secondary | ICD-10-CM

## 2023-07-19 DIAGNOSIS — Z09 Encounter for follow-up examination after completed treatment for conditions other than malignant neoplasm: Secondary | ICD-10-CM

## 2023-07-19 DIAGNOSIS — N503 Cyst of epididymis: Secondary | ICD-10-CM

## 2023-07-19 DIAGNOSIS — Z87438 Personal history of other diseases of male genital organs: Secondary | ICD-10-CM

## 2023-07-19 DIAGNOSIS — N4342 Spermatocele of epididymis, multiple: Secondary | ICD-10-CM

## 2023-08-30 ENCOUNTER — Ambulatory Visit: Admitting: Urology

## 2023-11-17 DIAGNOSIS — R58 Hemorrhage, not elsewhere classified: Secondary | ICD-10-CM | POA: Diagnosis not present
